# Patient Record
Sex: Male | Born: 1950 | Race: White | Hispanic: No | Marital: Married | Smoking: Never smoker
Health system: Southern US, Community
[De-identification: ages and names within clinical notes are randomized; demographics above are authoritative.]

## PROBLEM LIST (undated history)

## (undated) DIAGNOSIS — R7989 Other specified abnormal findings of blood chemistry: Secondary | ICD-10-CM

## (undated) DIAGNOSIS — I491 Atrial premature depolarization: Secondary | ICD-10-CM

## (undated) DIAGNOSIS — I10 Essential (primary) hypertension: Secondary | ICD-10-CM

## (undated) DIAGNOSIS — T148XXA Other injury of unspecified body region, initial encounter: Secondary | ICD-10-CM

## (undated) DIAGNOSIS — M1990 Unspecified osteoarthritis, unspecified site: Secondary | ICD-10-CM

## (undated) DIAGNOSIS — H9319 Tinnitus, unspecified ear: Secondary | ICD-10-CM

## (undated) DIAGNOSIS — A64 Unspecified sexually transmitted disease: Secondary | ICD-10-CM

## (undated) HISTORY — PX: HERNIA REPAIR: SHX51

## (undated) HISTORY — DX: Atrial premature depolarization: I49.1

## (undated) HISTORY — DX: Other injury of unspecified body region, initial encounter: T14.8XXA

## (undated) HISTORY — PX: APPENDECTOMY: SHX54

## (undated) HISTORY — DX: Unspecified sexually transmitted disease: A64

## (undated) HISTORY — PX: EYE SURGERY: SHX253

## (undated) HISTORY — DX: Other specified abnormal findings of blood chemistry: R79.89

## (undated) HISTORY — PX: TONSILLECTOMY: SUR1361

## (undated) HISTORY — DX: Tinnitus, unspecified ear: H93.19

## (undated) HISTORY — PX: SHOULDER ARTHROSCOPY: SHX128

---

## 2009-07-10 ENCOUNTER — Encounter: Admission: RE | Admit: 2009-07-10 | Discharge: 2009-07-10 | Payer: Self-pay | Admitting: Family Medicine

## 2011-01-23 ENCOUNTER — Other Ambulatory Visit: Payer: Self-pay | Admitting: Family Medicine

## 2011-01-23 DIAGNOSIS — Z139 Encounter for screening, unspecified: Secondary | ICD-10-CM

## 2011-01-23 DIAGNOSIS — R51 Headache: Secondary | ICD-10-CM

## 2011-01-24 ENCOUNTER — Other Ambulatory Visit: Payer: Self-pay

## 2011-02-01 ENCOUNTER — Other Ambulatory Visit: Payer: Self-pay | Admitting: Family Medicine

## 2011-02-01 DIAGNOSIS — Z139 Encounter for screening, unspecified: Secondary | ICD-10-CM

## 2011-02-04 ENCOUNTER — Ambulatory Visit
Admission: RE | Admit: 2011-02-04 | Discharge: 2011-02-04 | Disposition: A | Discharge status: Home / Self Care | Payer: BC Managed Care – PPO | Source: Ambulatory Visit | Attending: Family Medicine | Admitting: Family Medicine

## 2011-02-04 DIAGNOSIS — R51 Headache: Secondary | ICD-10-CM

## 2011-02-04 DIAGNOSIS — Z139 Encounter for screening, unspecified: Secondary | ICD-10-CM

## 2013-02-26 ENCOUNTER — Other Ambulatory Visit: Payer: Self-pay | Admitting: Orthopaedic Surgery

## 2013-02-26 DIAGNOSIS — M25511 Pain in right shoulder: Secondary | ICD-10-CM

## 2013-03-02 ENCOUNTER — Ambulatory Visit
Admission: RE | Admit: 2013-03-02 | Discharge: 2013-03-02 | Disposition: A | Discharge status: Home / Self Care | Payer: BC Managed Care – PPO | Source: Ambulatory Visit | Attending: Orthopaedic Surgery | Admitting: Orthopaedic Surgery

## 2013-03-02 DIAGNOSIS — M25511 Pain in right shoulder: Secondary | ICD-10-CM

## 2013-04-27 ENCOUNTER — Other Ambulatory Visit: Payer: Self-pay | Admitting: Family Medicine

## 2013-04-27 DIAGNOSIS — N390 Urinary tract infection, site not specified: Secondary | ICD-10-CM

## 2013-04-29 ENCOUNTER — Other Ambulatory Visit: Payer: BC Managed Care – PPO

## 2013-05-03 ENCOUNTER — Other Ambulatory Visit: Payer: BC Managed Care – PPO

## 2013-05-06 ENCOUNTER — Other Ambulatory Visit: Payer: BC Managed Care – PPO

## 2013-05-10 ENCOUNTER — Ambulatory Visit
Admission: RE | Admit: 2013-05-10 | Discharge: 2013-05-10 | Disposition: A | Discharge status: Home / Self Care | Payer: BC Managed Care – PPO | Source: Ambulatory Visit | Attending: Family Medicine | Admitting: Family Medicine

## 2013-05-10 DIAGNOSIS — N390 Urinary tract infection, site not specified: Secondary | ICD-10-CM

## 2013-05-10 MED ORDER — IOHEXOL 300 MG/ML  SOLN
125.0000 mL | Freq: Once | INTRAMUSCULAR | Status: AC | PRN
  Administered 2013-05-10: 125 mL via INTRAVENOUS

## 2014-01-22 ENCOUNTER — Ambulatory Visit: Payer: Self-pay | Admitting: Orthopedic Surgery

## 2014-01-22 NOTE — Progress Notes (Signed)
Preoperative surgical orders have been place into the Epic hospital system for Steve Murphy on 01/22/2014, 9:32 AM  by Patrica DuelPERKINS, Danyale Ridinger for surgery on 02/04/2014.  Preop Total Hip - Anterior Approach orders including Experel Injecion, IV Tylenol, and IV Decadron as long as there are no contraindications to the above medications. Steve Peacerew Shanard Treto, PA-C

## 2014-01-24 ENCOUNTER — Encounter (HOSPITAL_COMMUNITY)
Admission: RE | Admit: 2014-01-24 | Discharge: 2014-01-24 | Disposition: A | Discharge status: Home / Self Care | Payer: BLUE CROSS/BLUE SHIELD | Source: Ambulatory Visit | Attending: Orthopedic Surgery | Admitting: Orthopedic Surgery

## 2014-01-24 ENCOUNTER — Other Ambulatory Visit: Payer: Self-pay

## 2014-01-24 ENCOUNTER — Encounter (HOSPITAL_COMMUNITY): Payer: Self-pay

## 2014-01-24 DIAGNOSIS — M1612 Unilateral primary osteoarthritis, left hip: Secondary | ICD-10-CM | POA: Diagnosis not present

## 2014-01-24 DIAGNOSIS — Z01810 Encounter for preprocedural cardiovascular examination: Secondary | ICD-10-CM | POA: Insufficient documentation

## 2014-01-24 DIAGNOSIS — Z01812 Encounter for preprocedural laboratory examination: Secondary | ICD-10-CM | POA: Insufficient documentation

## 2014-01-24 DIAGNOSIS — Z01818 Encounter for other preprocedural examination: Secondary | ICD-10-CM | POA: Diagnosis not present

## 2014-01-24 HISTORY — DX: Essential (primary) hypertension: I10

## 2014-01-24 HISTORY — DX: Unspecified osteoarthritis, unspecified site: M19.90

## 2014-01-24 LAB — PROTIME-INR
INR: 0.98 (ref 0.00–1.49)
PROTHROMBIN TIME: 13.1 s (ref 11.6–15.2)

## 2014-01-24 LAB — CBC
HCT: 47.9 % (ref 39.0–52.0)
Hemoglobin: 16.2 g/dL (ref 13.0–17.0)
MCH: 30.8 pg (ref 26.0–34.0)
MCHC: 33.8 g/dL (ref 30.0–36.0)
MCV: 91.1 fL (ref 78.0–100.0)
PLATELETS: 256 10*3/uL (ref 150–400)
RBC: 5.26 MIL/uL (ref 4.22–5.81)
RDW: 13.1 % (ref 11.5–15.5)
WBC: 8.7 10*3/uL (ref 4.0–10.5)

## 2014-01-24 LAB — URINALYSIS, ROUTINE W REFLEX MICROSCOPIC
BILIRUBIN URINE: NEGATIVE
Glucose, UA: NEGATIVE mg/dL
Hgb urine dipstick: NEGATIVE
Ketones, ur: NEGATIVE mg/dL
Leukocytes, UA: NEGATIVE
Nitrite: NEGATIVE
PROTEIN: NEGATIVE mg/dL
SPECIFIC GRAVITY, URINE: 1.017 (ref 1.005–1.030)
Urobilinogen, UA: 0.2 mg/dL (ref 0.0–1.0)
pH: 6 (ref 5.0–8.0)

## 2014-01-24 LAB — APTT: APTT: 26 s (ref 24–37)

## 2014-01-24 LAB — COMPREHENSIVE METABOLIC PANEL
ALBUMIN: 4.4 g/dL (ref 3.5–5.2)
ALT: 22 U/L (ref 0–53)
ANION GAP: 5 (ref 5–15)
AST: 21 U/L (ref 0–37)
Alkaline Phosphatase: 93 U/L (ref 39–117)
BUN: 21 mg/dL (ref 6–23)
CO2: 27 mmol/L (ref 19–32)
CREATININE: 1.21 mg/dL (ref 0.50–1.35)
Calcium: 9.4 mg/dL (ref 8.4–10.5)
Chloride: 105 mEq/L (ref 96–112)
GFR calc non Af Amer: 62 mL/min — ABNORMAL LOW (ref >90)
GFR, EST AFRICAN AMERICAN: 71 mL/min — AB (ref >90)
Glucose, Bld: 97 mg/dL (ref 70–99)
Potassium: 4.3 mmol/L (ref 3.5–5.1)
Sodium: 137 mmol/L (ref 135–145)
Total Bilirubin: 0.9 mg/dL (ref 0.3–1.2)
Total Protein: 7 g/dL (ref 6.0–8.3)

## 2014-01-24 LAB — SURGICAL PCR SCREEN
MRSA, PCR: NEGATIVE
STAPHYLOCOCCUS AUREUS: POSITIVE — AB

## 2014-01-24 LAB — ABO/RH: ABO/RH(D): B POS

## 2014-01-24 LAB — TYPE AND SCREEN
ABO/RH(D): B POS
Antibody Screen: NEGATIVE

## 2014-01-24 NOTE — Progress Notes (Signed)
Mupirocin Ointment Rx called into [facility] for positive PCR of staph. Pt notified and voiced understanding.

## 2014-01-24 NOTE — Pre-Procedure Instructions (Addendum)
Steve Murphy  01/24/2014   Your procedure is scheduled on:  02/04/2014  [facility]   ENTRANCE  A at 7:45 AM.  Call this number if you have problems the morning of surgery: [phone]   Remember:   Do not eat food or drink liquids after midnight.  On THURSDAY   Take these medicines the morning of surgery with A SIP OF WATER: NOTHING   Do not wear jewelry   Do not wear lotions, powders, or perfumes. You may wear deodorant.    Men may shave face and neck.   Do not bring valuables to the hospital.  [facility] is not responsible                  for any belongings or valuables.               Contacts, dentures or bridgework may not be worn into surgery.   Leave suitcase in the car. After surgery it may be brought to your room.   For patients admitted to the hospital, discharge time is determined by your  treatment team.               Patients discharged the day of surgery will not be allowed to drive  home.  Name and phone number of your driver: wife   Special Instructions: Special Instructions: [place] - Preparing for Surgery  Before surgery, you can play an important role.  Because skin is not sterile, your skin needs to be as free of germs as possible.  You can reduce the number of germs on you skin by washing with CHG (chlorahexidine gluconate) soap before surgery.  CHG is an antiseptic cleaner which kills germs and bonds with the skin to continue killing germs even after washing.  Please DO NOT use if you have an allergy to CHG or antibacterial soaps.  If your skin becomes reddened/irritated stop using the CHG and inform your nurse when you arrive at Short Stay.  Do not shave (including legs and underarms) for at least 48 hours prior to the first CHG shower.  You may shave your face.  Please follow these instructions carefully:   1.  Shower with CHG Soap the night before surgery and the  morning of Surgery.  2.  If you choose to wash  your hair, wash your hair first as usual with your  normal shampoo.  3.  After you shampoo, rinse your hair and body thoroughly to remove the  Shampoo.  4.  Use CHG as you would any other liquid soap.  You can apply chg directly to the skin and wash gently with scrungie or a clean washcloth.  5.  Apply the CHG Soap to your body ONLY FROM THE NECK DOWN.    Do not use on open wounds or open sores.  Avoid contact with your eyes, ears, mouth and genitals (private parts).  Wash genitals (private parts)   with your normal soap.  6.  Wash thoroughly, paying special attention to the area where your surgery will be performed.  7.  Thoroughly rinse your body with warm water from the neck down.  8.  DO NOT shower/wash with your normal soap after using and rinsing off   the CHG Soap.  9.  Pat yourself dry with a clean towel.            10.  Wear clean pajamas.  11.  Place clean sheets on your bed the night of your first shower and do not sleep with pets.  Day of Surgery  Do not apply any lotions/deodorants the morning of surgery.  Please wear clean clothes to the hospital/surgery center.   Please read over the following fact sheets that you were given: Pain Booklet, Coughing and Deep Breathing, Blood Transfusion Information, MRSA Information and Surgical Site Infection Prevention

## 2014-01-24 NOTE — Progress Notes (Signed)
Pt. [facility]- [facility]- for car racing screening. Had EKG- /w PCP Fayrene FearingJames Little 2-3 yrs. Ago.

## 2014-02-03 ENCOUNTER — Ambulatory Visit: Payer: Self-pay | Admitting: Orthopedic Surgery

## 2014-02-03 MED ORDER — CEFAZOLIN SODIUM-DEXTROSE 2-3 GM-% IV SOLR
2.0000 g | INTRAVENOUS | Status: AC
  Administered 2014-02-04: 2 g via INTRAVENOUS
  Filled 2014-02-03: qty 50

## 2014-02-03 MED ORDER — DEXAMETHASONE SODIUM PHOSPHATE 10 MG/ML IJ SOLN
10.0000 mg | Freq: Once | INTRAMUSCULAR | Status: DC
  Filled 2014-02-03: qty 1

## 2014-02-03 MED ORDER — TRANEXAMIC ACID 100 MG/ML IV SOLN
1000.0000 mg | INTRAVENOUS | Status: AC
  Administered 2014-02-04: 1000 mg via INTRAVENOUS
  Filled 2014-02-03: qty 10

## 2014-02-03 MED ORDER — BUPIVACAINE LIPOSOME 1.3 % IJ SUSP
20.0000 mL | Freq: Once | INTRAMUSCULAR | Status: DC
  Filled 2014-02-03: qty 20

## 2014-02-03 MED ORDER — SODIUM CHLORIDE 0.9 % IV SOLN: INTRAVENOUS | Status: DC

## 2014-02-03 MED ORDER — ACETAMINOPHEN 10 MG/ML IV SOLN: 1000.0000 mg | Freq: Once | INTRAVENOUS | Status: DC

## 2014-02-03 NOTE — Anesthesia Preprocedure Evaluation (Addendum)
Anesthesia Evaluation  Patient identified by MRN, date of birth, ID band Patient awake    Reviewed: Allergy & Precautions, H&P , NPO status , Patient's Chart, lab work & pertinent test results, reviewed documented beta blocker date and time   Airway Mallampati: II  TM Distance: >3 FB     Dental  (+) Teeth Intact, Dental Advidsory Given   Pulmonary neg pulmonary ROS,          Cardiovascular hypertension, Pt. on medications  EKG WNL   Neuro/Psych negative neurological ROS  negative psych ROS   GI/Hepatic Neg liver ROS,   Endo/Other  negative endocrine ROS  Renal/GU negative Renal ROSGFR 68     Musculoskeletal   Abdominal   Peds  Hematology negative hematology ROS (+)   Anesthesia Other Findings   Reproductive/Obstetrics                          Anesthesia Physical Anesthesia Plan  ASA: II  Anesthesia Plan: General   Post-op Pain Management:    Induction: Intravenous  Airway Management Planned: Oral ETT  Additional Equipment:   Intra-op Plan:   Post-operative Plan: Extubation in OR  Informed Consent: I have reviewed the patients History and Physical, chart, labs and discussed the procedure including the risks, benefits and alternatives for the proposed anesthesia with the patient or authorized representative who has indicated his/her understanding and acceptance.   Dental Advisory Given  Plan Discussed with: Anesthesiologist, CRNA and Surgeon  Anesthesia Plan Comments:       Anesthesia Quick Evaluation

## 2014-02-03 NOTE — H&P (Signed)
Steve Murphy DOB: 09/25/1950 Married / Language: English / Race: White Male Date of Admission: 02/04/2014 CC:  Left Hip Pain History of Present Illness The patient is a 63 year old male who comes in  for a preoperative History and Physical. The patient is scheduled for a left total hip arthroplasty (anterior appraoch) to be performed by Dr. Frank V. Aluisio, MD at [facility] on 02-04-2014. The patient is a 63 year old male who presents with a hip problem. The patient reports left hip problems including pain symptoms that have been present for 4 month(s). The symptoms began without any known injury. Symptoms reported include hip pain and difficulty flexing hip The patient reports symptoms radiating to the: left groin and left thigh anteriorly. The patient describes the hip problem as aching.The symptoms are described as moderate in severity (sometimes severe).The patient feels as if their symptoms are does feel they are worsening. Associated symptoms include low back pain, numbness in the leg (occasionally) and weakness of the leg. Current treatment includes nonsteroidal anti-inflammatory drugs. Prior to being seen today the patient was previously evaluated by a colleague. Previous workup for this problem has included hip x-rays.  He says that he has had pain this for past 3 or 4 months. He describes groin pain. He does have groin pain in the left groin. No issues with the right side. Not having any issues along the lateral hip at this point in time. It is not radiating down his legs, towards his knees. He is at a point now where he is having pain at all times. It is worse with weightbearing activities. 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 This is getting to the point where it is limiting his activity and interfering with his duties on the farm. He needs a total hip replacement. He has had problems with the hip for many getting much worse in  the past year. He has pain with all activities and is now starting to get pain as rest. It is limiting what he can and cannot do. Intraarticular injection did not provide much benefit. 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 Risks and benefits of the surgery have been discussed with the patient and they elect to proceed with surgery.  There are on active contraindications to upcoming procedure such as ongoing infection or progressive neurological disease.  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  Social History  No history of drug/alcohol rehab Not under pain contract Marital status married Exercise Exercises rarely Living situation live with spouse Tobacco use Never smoker. 11/04/2013 Number of flights of stairs before winded 1 Tobacco / smoke exposure 11/04/2013: no Current work status working full time Children 2 Current drinker 11/04/2013: Currently drinks beer only occasionally per week  Medication History Naproxen (Oral as needed) Specific dose unknown - Active. Acyclovir (Oral as needed) Specific dose unknown - Active. Ibuprofen (200MG Capsule, Oral as needed) Active. Multivitamin (Oral) Active. Vitamin D (Oral) Specific dose unknown - Active. Fish Oil (Oral) Specific dose unknown - Active.  Past Surgical History Tonsillectomy Appendectomy Rotator Cuff Repair right Hernia Repair   Bilateral Lasix Procedures Bilateral  Review of Systems: General: No chills, fevers, night sweats, fatigue. Neuro:  No seizures, syncope, paralysis, visual problems. Respiratory:  No shortness of  breath, productive cough, hemoptysis. Cardiovascular: No chest pain, angina, palpitations, orthopnea. GI: No nausea, vomiting, diarrhea, constipation. GU:  No dysuria, hematuria, discharge. Musculoskeletal:  Joint pain   Vitals  Weight: 211 lb Height: 71in Weight was reported by patient. Height was reported by patient. Body Surface Area: 2.16 m Body Mass Index: 29.43 kg/m  BP: 152/100 (Sitting, Left Arm, Standard)   Physical Exam General Mental Status -Alert, cooperative and good historian. General Appearance-pleasant, Not in acute distress. Orientation-Oriented X3. Build & Nutrition-Well nourished and Well developed.  Head and Neck Head-normocephalic, atraumatic . Neck Global Assessment - supple, no bruit auscultated on the right, no bruit auscultated on the left.  Eye Vision-Wears corrective lenses(readers). Pupil - Bilateral-Regular and Round. Motion - Bilateral-EOMI.  Chest and Lung Exam Auscultation Breath sounds - clear at anterior chest wall and clear at posterior chest wall. Adventitious sounds - No Adventitious sounds.  Cardiovascular Auscultation Rhythm - Regular rate and rhythm. Heart Sounds - S1 WNL and S2 WNL. Murmurs & Other Heart Sounds - Auscultation of the heart reveals - No Murmurs.  Abdomen Palpation/Percussion Tenderness - Abdomen is non-tender to palpation. Rigidity (guarding) - Abdomen is soft. Auscultation Auscultation of the abdomen reveals - Bowel sounds normal.  Male Genitourinary Note: Not done, not pertinent to present illness   Musculoskeletal Note: He is alert and oriented in no acute distress. He does have some discomfort with lumbar range of motion most notably over that SI joint. He is nontender over the right and left greater trochanteric bursa. No masses or tumors palpated over this area. The right hip he has normal painless range of motion in the left hip. Flexion to about 110 degrees, internal  rotation of 10 degrees, external rotation of 20, abduction 20. He does have significant discomfort with passive and active rotation of that hip. The knees are nontender to palpation. No effusion or instability noted about the knees. Distal pulses are 2+. Sensation and motor function are intact in the lower extremities.  RADIOGRAPHS: AP and lateral views of the lumbar spine shows that he does have some degenerative changes, disc spaces most notably at L4-5 and L5-S1 with some signs of spondylolisthesis at L5. He does have significant arthritic changes in that left hip. He is bone on bone and does have some osteophyte formation most notably in the inferior portion of that joint.  Assessment & Plan Primary osteoarthritis of left hip (M16.12)  Note:Plan is for a Left Total Hip Replacement -anterior approach by Dr. Aluisio.  Plan is to go home.  PCP - Dr. James Little  The patient does not have any contraindications and will receive TXA (tranexamic acid) prior to surgery.  No problems with anesthesia.  Drew Dexton Zwilling, PA-C  

## 2014-02-04 ENCOUNTER — Inpatient Hospital Stay (HOSPITAL_COMMUNITY)
Admission: RE | Admit: 2014-02-04 | Discharge: 2014-02-06 | DRG: 470 | Disposition: A | Discharge status: Home Health | Payer: BLUE CROSS/BLUE SHIELD | Source: Ambulatory Visit | Attending: Orthopedic Surgery | Admitting: Orthopedic Surgery

## 2014-02-04 ENCOUNTER — Encounter (HOSPITAL_COMMUNITY)
Admission: RE | Disposition: A | Discharge status: Home Health | Payer: Self-pay | Source: Ambulatory Visit | Attending: Orthopedic Surgery

## 2014-02-04 ENCOUNTER — Encounter (HOSPITAL_COMMUNITY): Payer: Self-pay | Admitting: *Deleted

## 2014-02-04 ENCOUNTER — Inpatient Hospital Stay (HOSPITAL_COMMUNITY): Payer: BLUE CROSS/BLUE SHIELD | Admitting: Anesthesiology

## 2014-02-04 ENCOUNTER — Inpatient Hospital Stay (HOSPITAL_COMMUNITY): Payer: BLUE CROSS/BLUE SHIELD

## 2014-02-04 DIAGNOSIS — M545 Low back pain: Secondary | ICD-10-CM | POA: Diagnosis present

## 2014-02-04 DIAGNOSIS — Z96649 Presence of unspecified artificial hip joint: Secondary | ICD-10-CM

## 2014-02-04 DIAGNOSIS — Z01810 Encounter for preprocedural cardiovascular examination: Secondary | ICD-10-CM | POA: Diagnosis not present

## 2014-02-04 DIAGNOSIS — M1612 Unilateral primary osteoarthritis, left hip: Principal | ICD-10-CM | POA: Diagnosis present

## 2014-02-04 DIAGNOSIS — Z791 Long term (current) use of non-steroidal anti-inflammatories (NSAID): Secondary | ICD-10-CM | POA: Diagnosis not present

## 2014-02-04 DIAGNOSIS — M169 Osteoarthritis of hip, unspecified: Secondary | ICD-10-CM | POA: Diagnosis present

## 2014-02-04 DIAGNOSIS — Z01812 Encounter for preprocedural laboratory examination: Secondary | ICD-10-CM | POA: Diagnosis not present

## 2014-02-04 DIAGNOSIS — M25552 Pain in left hip: Secondary | ICD-10-CM | POA: Diagnosis present

## 2014-02-04 DIAGNOSIS — G8929 Other chronic pain: Secondary | ICD-10-CM | POA: Diagnosis present

## 2014-02-04 DIAGNOSIS — M25752 Osteophyte, left hip: Secondary | ICD-10-CM | POA: Diagnosis present

## 2014-02-04 DIAGNOSIS — I10 Essential (primary) hypertension: Secondary | ICD-10-CM | POA: Diagnosis present

## 2014-02-04 DIAGNOSIS — Z79899 Other long term (current) drug therapy: Secondary | ICD-10-CM

## 2014-02-04 HISTORY — PX: TOTAL HIP ARTHROPLASTY: SHX124

## 2014-02-04 SURGERY — ARTHROPLASTY, HIP, TOTAL, ANTERIOR APPROACH
Anesthesia: General | Site: Hip | Laterality: Left

## 2014-02-04 MED ORDER — FENTANYL CITRATE 0.05 MG/ML IJ SOLN
25.0000 ug | INTRAMUSCULAR | Status: DC | PRN
  Administered 2014-02-04 (×3): 50 ug via INTRAVENOUS

## 2014-02-04 MED ORDER — DIPHENHYDRAMINE HCL 12.5 MG/5ML PO ELIX: 12.5000 mg | ORAL_SOLUTION | ORAL | Status: DC | PRN

## 2014-02-04 MED ORDER — DOCUSATE SODIUM 100 MG PO CAPS
100.0000 mg | ORAL_CAPSULE | Freq: Two times a day (BID) | ORAL | Status: DC
  Administered 2014-02-04 – 2014-02-06 (×5): 100 mg via ORAL
  Filled 2014-02-04 (×5): qty 1

## 2014-02-04 MED ORDER — ACETAMINOPHEN 325 MG PO TABS: 650.0000 mg | ORAL_TABLET | Freq: Four times a day (QID) | ORAL | Status: DC | PRN

## 2014-02-04 MED ORDER — KETOROLAC TROMETHAMINE 15 MG/ML IJ SOLN
7.5000 mg | Freq: Four times a day (QID) | INTRAMUSCULAR | Status: AC | PRN
  Administered 2014-02-04: 7.5 mg via INTRAVENOUS
  Filled 2014-02-04: qty 1

## 2014-02-04 MED ORDER — PROPOFOL 10 MG/ML IV BOLUS
INTRAVENOUS | Status: AC
  Filled 2014-02-04: qty 20

## 2014-02-04 MED ORDER — MIDAZOLAM HCL 5 MG/5ML IJ SOLN
INTRAMUSCULAR | Status: DC | PRN
  Administered 2014-02-04: 2 mg via INTRAVENOUS

## 2014-02-04 MED ORDER — METHOCARBAMOL 500 MG PO TABS
500.0000 mg | ORAL_TABLET | Freq: Four times a day (QID) | ORAL | Status: DC | PRN
  Administered 2014-02-05 – 2014-02-06 (×4): 500 mg via ORAL
  Filled 2014-02-04 (×5): qty 1

## 2014-02-04 MED ORDER — MORPHINE SULFATE 2 MG/ML IJ SOLN
1.0000 mg | INTRAMUSCULAR | Status: DC | PRN
  Administered 2014-02-04: 2 mg via INTRAVENOUS
  Filled 2014-02-04: qty 1

## 2014-02-04 MED ORDER — PHENOL 1.4 % MT LIQD: 1.0000 | OROMUCOSAL | Status: DC | PRN

## 2014-02-04 MED ORDER — ACETAMINOPHEN 500 MG PO TABS
1000.0000 mg | ORAL_TABLET | Freq: Four times a day (QID) | ORAL | Status: AC
  Administered 2014-02-04 – 2014-02-05 (×4): 1000 mg via ORAL
  Filled 2014-02-04 (×5): qty 2

## 2014-02-04 MED ORDER — OXYCODONE HCL 5 MG PO TABS
ORAL_TABLET | ORAL | Status: AC
  Filled 2014-02-04: qty 2

## 2014-02-04 MED ORDER — METOCLOPRAMIDE HCL 5 MG/ML IJ SOLN
5.0000 mg | Freq: Three times a day (TID) | INTRAMUSCULAR | Status: DC | PRN
Start: 2014-02-04 — End: 2014-02-06

## 2014-02-04 MED ORDER — MENTHOL 3 MG MT LOZG: 1.0000 | LOZENGE | OROMUCOSAL | Status: DC | PRN

## 2014-02-04 MED ORDER — FENTANYL CITRATE 0.05 MG/ML IJ SOLN
INTRAMUSCULAR | Status: DC | PRN
  Administered 2014-02-04 (×2): 100 ug via INTRAVENOUS
  Administered 2014-02-04: 50 ug via INTRAVENOUS
  Administered 2014-02-04: 100 ug via INTRAVENOUS

## 2014-02-04 MED ORDER — LIDOCAINE HCL (CARDIAC) 20 MG/ML IV SOLN
INTRAVENOUS | Status: DC | PRN
  Administered 2014-02-04: 100 mg via INTRAVENOUS

## 2014-02-04 MED ORDER — MEPERIDINE HCL 25 MG/ML IJ SOLN: 6.2500 mg | INTRAMUSCULAR | Status: DC | PRN

## 2014-02-04 MED ORDER — DEXAMETHASONE SODIUM PHOSPHATE 10 MG/ML IJ SOLN
INTRAMUSCULAR | Status: DC | PRN
  Administered 2014-02-04: 10 mg via INTRAVENOUS

## 2014-02-04 MED ORDER — POLYETHYLENE GLYCOL 3350 17 G PO PACK: 17.0000 g | PACK | Freq: Every day | ORAL | Status: DC | PRN

## 2014-02-04 MED ORDER — CHLORHEXIDINE GLUCONATE 4 % EX LIQD: 60.0000 mL | Freq: Once | CUTANEOUS | Status: DC

## 2014-02-04 MED ORDER — LACTATED RINGERS IV SOLN
INTRAVENOUS | Status: DC
  Administered 2014-02-04: 08:00:00 via INTRAVENOUS

## 2014-02-04 MED ORDER — ROCURONIUM BROMIDE 50 MG/5ML IV SOLN
INTRAVENOUS | Status: AC
  Filled 2014-02-04: qty 1

## 2014-02-04 MED ORDER — OXYCODONE HCL 5 MG PO TABS
5.0000 mg | ORAL_TABLET | ORAL | Status: DC | PRN
  Administered 2014-02-04 – 2014-02-06 (×8): 10 mg via ORAL
  Filled 2014-02-04 (×7): qty 2

## 2014-02-04 MED ORDER — BUPIVACAINE HCL (PF) 0.25 % IJ SOLN
INTRAMUSCULAR | Status: AC
  Filled 2014-02-04: qty 30

## 2014-02-04 MED ORDER — METHOCARBAMOL 1000 MG/10ML IJ SOLN
500.0000 mg | Freq: Four times a day (QID) | INTRAVENOUS | Status: DC | PRN
  Administered 2014-02-04: 500 mg via INTRAVENOUS
  Filled 2014-02-04 (×2): qty 5

## 2014-02-04 MED ORDER — MIDAZOLAM HCL 2 MG/2ML IJ SOLN
INTRAMUSCULAR | Status: AC
  Filled 2014-02-04: qty 2

## 2014-02-04 MED ORDER — METOCLOPRAMIDE HCL 10 MG PO TABS
5.0000 mg | ORAL_TABLET | Freq: Three times a day (TID) | ORAL | Status: DC | PRN
Start: 2014-02-04 — End: 2014-02-06

## 2014-02-04 MED ORDER — DEXAMETHASONE SODIUM PHOSPHATE 10 MG/ML IJ SOLN
10.0000 mg | Freq: Once | INTRAMUSCULAR | Status: AC
  Administered 2014-02-05: 10 mg via INTRAVENOUS
  Filled 2014-02-04: qty 1

## 2014-02-04 MED ORDER — FLEET ENEMA 7-19 GM/118ML RE ENEM: 1.0000 | ENEMA | Freq: Once | RECTAL | Status: AC | PRN

## 2014-02-04 MED ORDER — PROPOFOL 10 MG/ML IV BOLUS
INTRAVENOUS | Status: DC | PRN
  Administered 2014-02-04: 150 mg via INTRAVENOUS

## 2014-02-04 MED ORDER — EPHEDRINE SULFATE 50 MG/ML IJ SOLN
INTRAMUSCULAR | Status: AC
  Filled 2014-02-04: qty 1

## 2014-02-04 MED ORDER — ACETAMINOPHEN 10 MG/ML IV SOLN
INTRAVENOUS | Status: AC
  Administered 2014-02-04: 1000 mg via INTRAVENOUS
  Filled 2014-02-04: qty 100

## 2014-02-04 MED ORDER — BUPIVACAINE HCL (PF) 0.25 % IJ SOLN
INTRAMUSCULAR | Status: DC | PRN
  Administered 2014-02-04: 20 mL

## 2014-02-04 MED ORDER — CEFAZOLIN SODIUM-DEXTROSE 2-3 GM-% IV SOLR
2.0000 g | Freq: Four times a day (QID) | INTRAVENOUS | Status: AC
  Administered 2014-02-04 (×2): 2 g via INTRAVENOUS
  Filled 2014-02-04 (×2): qty 50

## 2014-02-04 MED ORDER — ONDANSETRON HCL 4 MG/2ML IJ SOLN: 4.0000 mg | Freq: Four times a day (QID) | INTRAMUSCULAR | Status: DC | PRN

## 2014-02-04 MED ORDER — METHOCARBAMOL 1000 MG/10ML IJ SOLN
500.0000 mg | INTRAVENOUS | Status: AC
  Administered 2014-02-04: 500 mg via INTRAVENOUS
  Filled 2014-02-04: qty 5

## 2014-02-04 MED ORDER — ONDANSETRON HCL 4 MG PO TABS: 4.0000 mg | ORAL_TABLET | Freq: Four times a day (QID) | ORAL | Status: DC | PRN

## 2014-02-04 MED ORDER — FENTANYL CITRATE 0.05 MG/ML IJ SOLN
INTRAMUSCULAR | Status: AC
  Filled 2014-02-04: qty 5

## 2014-02-04 MED ORDER — FENTANYL CITRATE 0.05 MG/ML IJ SOLN
INTRAMUSCULAR | Status: AC
  Filled 2014-02-04: qty 2

## 2014-02-04 MED ORDER — ONDANSETRON HCL 4 MG/2ML IJ SOLN
INTRAMUSCULAR | Status: DC | PRN
Start: 2014-02-04 — End: 2014-02-04
  Administered 2014-02-04: 4 mg via INTRAVENOUS

## 2014-02-04 MED ORDER — FENTANYL CITRATE 0.05 MG/ML IJ SOLN
INTRAMUSCULAR | Status: AC
  Administered 2014-02-04: 50 ug via INTRAVENOUS
  Filled 2014-02-04: qty 2

## 2014-02-04 MED ORDER — SODIUM CHLORIDE 0.9 % IJ SOLN
INTRAMUSCULAR | Status: DC | PRN
  Administered 2014-02-04: 30 mL

## 2014-02-04 MED ORDER — POTASSIUM CHLORIDE IN NACL 20-0.9 MEQ/L-% IV SOLN
INTRAVENOUS | Status: DC
  Administered 2014-02-04: 21:00:00 via INTRAVENOUS
  Filled 2014-02-04 (×5): qty 1000

## 2014-02-04 MED ORDER — 0.9 % SODIUM CHLORIDE (POUR BTL) OPTIME
TOPICAL | Status: DC | PRN
  Administered 2014-02-04: 1000 mL

## 2014-02-04 MED ORDER — DEXMEDETOMIDINE HCL 200 MCG/2ML IV SOLN
INTRAVENOUS | Status: DC | PRN
  Administered 2014-02-04 (×2): 12 ug via INTRAVENOUS

## 2014-02-04 MED ORDER — LACTATED RINGERS IV SOLN
INTRAVENOUS | Status: DC | PRN
  Administered 2014-02-04 (×3): via INTRAVENOUS

## 2014-02-04 MED ORDER — RIVAROXABAN 10 MG PO TABS
10.0000 mg | ORAL_TABLET | Freq: Every day | ORAL | Status: DC
  Administered 2014-02-05 – 2014-02-06 (×2): 10 mg via ORAL
  Filled 2014-02-04 (×3): qty 1

## 2014-02-04 MED ORDER — TRAMADOL HCL 50 MG PO TABS: 50.0000 mg | ORAL_TABLET | Freq: Four times a day (QID) | ORAL | Status: DC | PRN

## 2014-02-04 MED ORDER — BUPIVACAINE LIPOSOME 1.3 % IJ SUSP
INTRAMUSCULAR | Status: DC | PRN
  Administered 2014-02-04: 20 mL

## 2014-02-04 MED ORDER — ACETAMINOPHEN 650 MG RE SUPP: 650.0000 mg | Freq: Four times a day (QID) | RECTAL | Status: DC | PRN

## 2014-02-04 MED ORDER — ROCURONIUM BROMIDE 100 MG/10ML IV SOLN
INTRAVENOUS | Status: DC | PRN
  Administered 2014-02-04: 40 mg via INTRAVENOUS

## 2014-02-04 MED ORDER — LIDOCAINE HCL (CARDIAC) 20 MG/ML IV SOLN
INTRAVENOUS | Status: AC
  Filled 2014-02-04: qty 5

## 2014-02-04 MED ORDER — ONDANSETRON HCL 4 MG/2ML IJ SOLN
INTRAMUSCULAR | Status: AC
  Filled 2014-02-04: qty 2

## 2014-02-04 MED ORDER — KETOROLAC TROMETHAMINE 15 MG/ML IJ SOLN
INTRAMUSCULAR | Status: AC
  Administered 2014-02-04: 7.5 mg via INTRAVENOUS
  Filled 2014-02-04: qty 1

## 2014-02-04 MED ORDER — BISACODYL 10 MG RE SUPP: 10.0000 mg | Freq: Every day | RECTAL | Status: DC | PRN

## 2014-02-04 MED ORDER — PROMETHAZINE HCL 25 MG/ML IJ SOLN: 6.2500 mg | INTRAMUSCULAR | Status: DC | PRN

## 2014-02-04 SURGICAL SUPPLY — 49 items
BLADE SAW SGTL 18X1.27X75 (BLADE) ×2 IMPLANT
BLADE SAW SGTL 18X1.27X75MM (BLADE) ×1
CAPT HIP TOTAL 2 ×2 IMPLANT
CLOSURE STERI-STRIP 1/2X4 (GAUZE/BANDAGES/DRESSINGS) ×2
CLOSURE WOUND 1/2 X4 (GAUZE/BANDAGES/DRESSINGS) ×1
CLSR STERI-STRIP ANTIMIC 1/2X4 (GAUZE/BANDAGES/DRESSINGS) ×4 IMPLANT
DRAPE C-ARM 42X72 X-RAY (DRAPES) ×3 IMPLANT
DRAPE IMP U-DRAPE 54X76 (DRAPES) ×1 IMPLANT
DRAPE STERI IOBAN 125X83 (DRAPES) ×3 IMPLANT
DRAPE U-SHAPE 47X51 STRL (DRAPES) ×7 IMPLANT
DRSG MEPILEX BORDER 4X4 (GAUZE/BANDAGES/DRESSINGS) ×2 IMPLANT
DRSG MEPILEX BORDER 4X8 (GAUZE/BANDAGES/DRESSINGS) ×3 IMPLANT
DURAPREP 26ML APPLICATOR (WOUND CARE) ×3 IMPLANT
ELECT BLADE 6.5 EXT (BLADE) ×3 IMPLANT
ELECT REM PT RETURN 9FT ADLT (ELECTROSURGICAL) ×3
ELECTRODE REM PT RTRN 9FT ADLT (ELECTROSURGICAL) ×1 IMPLANT
EVACUATOR 1/8 PVC DRAIN (DRAIN) ×3 IMPLANT
FACESHIELD WRAPAROUND (MASK) ×9 IMPLANT
FACESHIELD WRAPAROUND OR TEAM (MASK) ×2 IMPLANT
GLOVE BIO SURGEON STRL SZ7.5 (GLOVE) ×3 IMPLANT
GLOVE BIO SURGEON STRL SZ8 (GLOVE) ×6 IMPLANT
GLOVE BIOGEL PI IND STRL 8 (GLOVE) ×2 IMPLANT
GLOVE BIOGEL PI IND STRL 8.5 (GLOVE) IMPLANT
GLOVE BIOGEL PI INDICATOR 8 (GLOVE) ×6
GLOVE BIOGEL PI INDICATOR 8.5 (GLOVE) ×2
GLOVE ECLIPSE 8.0 STRL XLNG CF (GLOVE) ×3 IMPLANT
GLOVE SURG ORTHO 8.5 STRL (GLOVE) ×2 IMPLANT
GLOVE SURG SS PI 6.5 STRL IVOR (GLOVE) ×2 IMPLANT
GOWN STRL REUS W/ TWL LRG LVL3 (GOWN DISPOSABLE) ×1 IMPLANT
GOWN STRL REUS W/ TWL XL LVL3 (GOWN DISPOSABLE) ×1 IMPLANT
GOWN STRL REUS W/TWL LRG LVL3 (GOWN DISPOSABLE) ×3
GOWN STRL REUS W/TWL XL LVL3 (GOWN DISPOSABLE) ×9
KIT BASIN OR (CUSTOM PROCEDURE TRAY) ×3 IMPLANT
NDL SAFETY ECLIPSE 18X1.5 (NEEDLE) ×1 IMPLANT
NEEDLE HYPO 18GX1.5 SHARP (NEEDLE) ×3
PACK TOTAL JOINT (CUSTOM PROCEDURE TRAY) ×3 IMPLANT
PACK UNIVERSAL I (CUSTOM PROCEDURE TRAY) ×3 IMPLANT
STRIP CLOSURE SKIN 1/2X4 (GAUZE/BANDAGES/DRESSINGS) ×1 IMPLANT
SUT ETHIBOND NAB CT1 #1 30IN (SUTURE) ×3 IMPLANT
SUT MNCRL AB 4-0 PS2 18 (SUTURE) ×3 IMPLANT
SUT VIC AB 1 CT1 27 (SUTURE) ×3
SUT VIC AB 1 CT1 27XBRD ANTBC (SUTURE) ×1 IMPLANT
SUT VIC AB 2-0 CT1 27 (SUTURE) ×6
SUT VIC AB 2-0 CT1 TAPERPNT 27 (SUTURE) ×2 IMPLANT
SUT VLOC 180 0 24IN GS25 (SUTURE) ×3 IMPLANT
SYR 20CC LL (SYRINGE) ×3 IMPLANT
SYR 50ML LL SCALE MARK (SYRINGE) ×3 IMPLANT
TOWEL OR 17X26 10 PK STRL BLUE (TOWEL DISPOSABLE) ×6 IMPLANT
TRAY FOLEY CATH 16FR SILVER (SET/KITS/TRAYS/PACK) ×3 IMPLANT

## 2014-02-04 NOTE — Op Note (Signed)
OPERATIVE REPORT  PREOPERATIVE DIAGNOSIS: Osteoarthritis of the Left hip.   POSTOPERATIVE DIAGNOSIS: Osteoarthritis of the Left  hip.   PROCEDURE: Left total hip arthroplasty, anterior approach.   SURGEON: Ollen Gross, MD   ASSISTANT: Avel Peace, PA-C  ANESTHESIA:  General  ESTIMATED BLOOD LOSS:- 300 ml  DRAINS: Hemovac x1.   COMPLICATIONS: None   CONDITION: PACU - hemodynamically stable.   BRIEF CLINICAL NOTE: Steve Murphy is a 64 y.o. male who has advanced end-  stage arthritis of his Left  hip with progressively worsening pain and  dysfunction.The patient has failed nonoperative management and presents for  total hip arthroplasty.   PROCEDURE IN DETAIL: After successful administration of spinal  anesthetic, the traction boots for the [facility] bed were placed on both  feet and the patient was placed onto the [facility] bed, boots placed into the leg  holders. The Left hip was then isolated from the perineum with plastic  drapes and prepped and draped in the usual sterile fashion. ASIS and  greater trochanter were marked and a oblique incision was made, starting  at about 1 cm lateral and 2 cm distal to the ASIS and coursing towards  the anterior cortex of the femur. The skin was cut with a 10 blade  through subcutaneous tissue to the level of the fascia overlying the  tensor fascia lata muscle. The fascia was then incised in line with the  incision at the junction of the anterior third and posterior 2/3rd. The  muscle was teased off the fascia and then the interval between the TFL  and the rectus was developed. The Hohmann retractor was then placed at  the top of the femoral neck over the capsule. The vessels overlying the  capsule were cauterized and the fat on top of the capsule was removed.  A Hohmann retractor was then placed anterior underneath the rectus  femoris to give exposure to the entire anterior capsule. A T-shaped  capsulotomy was performed. The  edges were tagged and the femoral head  was identified.       Osteophytes are removed off the superior acetabulum.  The femoral neck was then cut in situ with an oscillating saw. Traction  was then applied to the left lower extremity utilizing the [facility]  traction. The femoral head was then removed. Retractors were placed  around the acetabulum and then circumferential removal of the labrum was  performed. Osteophytes were also removed. Reaming starts at 45 mm to  medialize and  Increased in 2 mm increments to 51 mm. We reamed in  approximately 40 degrees of abduction, 20 degrees anteversion. A 52 mm  pinnacle acetabular shell was then impacted in anatomic position under  fluoroscopic guidance with excellent purchase. We did not need to place  any additional dome screws. A 32 mm neutral + 4 marathon liner was then  placed into the acetabular shell.       The femoral lift was then placed along the lateral aspect of the femur  just distal to the vastus ridge. The leg was  externally rotated and capsule  was stripped off the inferior aspect of the femoral neck down to the  level of the lesser trochanter, this was done with electrocautery. The femur was lifted after this was performed. The  leg was then placed and extended in adducted position to essentially delivering the femur. We also removed the capsule superiorly and the  piriformis from the piriformis  fossa to gain excellent exposure of the  proximal femur. Rongeur was used to remove some cancellous bone to get  into the lateral portion of the proximal femur for placement of the  initial starter reamer. The starter broaches was placed  the starter broach  and was shown to go down the center of the canal. Broaching  with the  Corail system was then performed starting at size 8, coursing  Up to size 13. A size 13 had excellent torsional and rotational  and axial stability. The trial standard offset neck was then placed  with a 32 + 5 trial  head. The hip was then reduced. We confirmed that  the stem was in the canal both on AP and lateral x-rays. It also has excellent sizing. The hip was reduced with outstanding stability through full extension, full external rotation,  and then flexion in adduction internal rotation. AP pelvis was taken  and the leg lengths were measured and found to be exactly equal. Hip  was then dislocated again and the femoral head and neck removed. The  femoral broach was removed. Size 13 Corail stem with a standard offset  neck was then impacted into the femur following native anteversion. Has  excellent purchase in the canal. Excellent torsional and rotational and  axial stability. It is confirmed to be in the canal on AP and lateral  fluoroscopic views. The 32 + 5 ceramic head was placed and the hip  reduced with outstanding stability. Again AP pelvis was taken and it  confirmed that the leg lengths were equal. The wound was then copiously  irrigated with saline solution and the capsule reattached and repaired  with Ethibond suture.  20 mL of Exparel mixed with 50 mL of saline then additional 20 ml of .25% Bupivicaine injected into the capsule and into the edge of the tensor fascia lata as well as subcutaneous tissue. The fascia overlying the tensor fascia lata was  then closed with a running #1 V-Loc. Subcu was closed with interrupted  2-0 Vicryl and subcuticular running 4-0 Monocryl. Incision was cleaned  and dried. Steri-Strips and a bulky sterile dressing applied. Hemovac  drain was hooked to suction and then he was awakened and transported to  recovery in stable condition.        Please note that a surgical assistant was a medical necessity for this procedure to perform it in a safe and expeditious manner. Assistant was necessary to provide appropriate retraction of vital neurovascular structures and to prevent femoral fracture and allow for anatomic placement of the prosthesis.  Ollen GrossFrank Sister Carbone, M.D.

## 2014-02-04 NOTE — Anesthesia Procedure Notes (Addendum)
Procedure Name: Intubation Date/Time: 02/04/2014 10:00 AM Performed by: Carmela RimaMARTINELLI, JOHN F Pre-anesthesia Checklist: Timeout performed, Patient being monitored, Suction available, Emergency Drugs available and Patient identified Patient Re-evaluated:Patient Re-evaluated prior to inductionOxygen Delivery Method: Circle system utilized Preoxygenation: Pre-oxygenation with 100% oxygen Intubation Type: IV induction Ventilation: Mask ventilation without difficulty Laryngoscope Size: Mac and 3 Grade View: Grade I Tube type: Oral Number of attempts: 1 Placement Confirmation: breath sounds checked- equal and bilateral,  positive ETCO2 and ETT inserted through vocal cords under direct vision Secured at: 23 cm Tube secured with: Tape Dental Injury: Teeth and Oropharynx as per pre-operative assessment

## 2014-02-04 NOTE — Interval H&P Note (Signed)
History and Physical Interval Note:  02/04/2014 8:46 AM  Steve Murphy  has presented today for surgery, with the diagnosis of LEFT HIP OA   The various methods of treatment have been discussed with the patient and family. After consideration of risks, benefits and other options for treatment, the patient has consented to  Procedure(s): LEFT TOTAL HIP ARTHROPLASTY ANTERIOR APPROACH (Left) as a surgical intervention .  The patient's history has been reviewed, patient examined, no change in status, stable for surgery.  I have reviewed the patient's chart and labs.  Questions were answered to the patient's satisfaction.     Loanne DrillingALUISIO,Jla Reynolds V

## 2014-02-04 NOTE — H&P (View-Only) (Signed)
Steve HarriesCharles Elliott DOB: 1950/07/22 Married / Language: English / Race: White Male Date of Admission: 02/04/2014 CC:  Left Hip Pain History of Present Illness The patient is a 64 year old male who comes in  for a preoperative History and Physical. The patient is scheduled for a left total hip arthroplasty (anterior appraoch) to be performed by Dr. Gus RankinFrank V. Aluisio, MD at [facility] on 02-04-2014. The patient is a 64 year old male who presents with a hip problem. The patient reports left hip problems including pain symptoms that have been present for 4 month(s). The symptoms began without any known injury. Symptoms reported include hip pain and difficulty flexing hip The patient reports symptoms radiating to the: left groin and left thigh anteriorly. The patient describes the hip problem as aching.The symptoms are described as moderate in severity (sometimes severe).The patient feels as if their symptoms are does feel they are worsening. Associated symptoms include low back pain, numbness in the leg (occasionally) and weakness of the leg. Current treatment includes nonsteroidal anti-inflammatory drugs. Prior to being seen today the patient was previously evaluated by a colleague. Previous workup for this problem has included hip x-rays.  He says that he has had pain this for past 3 or 4 months. He describes groin pain. He does have groin pain in the left groin. No issues with the right side. Not having any issues along the lateral hip at this point in time. It is not radiating down his legs, towards his knees. He is at a point now where he is having pain at all times. It is worse with weightbearing activities. 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 This is getting to the point where it is limiting his activity and interfering with his duties on the farm. He needs a total hip replacement. He has had problems with the hip for many getting much worse in  the past year. He has pain with all activities and is now starting to get pain as rest. It is limiting what he can and cannot do. Intraarticular injection did not provide much benefit. 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 Risks and benefits of the surgery have been discussed with the patient and they elect to proceed with surgery.  There are on active contraindications to upcoming procedure such as ongoing infection or progressive neurological disease.  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  Social History  No history of drug/alcohol rehab Not under pain contract Marital status married Exercise Exercises rarely Living situation live with spouse Tobacco use Never smoker. 11/04/2013 Number of flights of stairs before winded 1 Tobacco / smoke exposure 11/04/2013: no Current work status working full time Children 2 Current drinker 11/04/2013: Currently drinks beer only occasionally per week  Medication History Naproxen (Oral as needed) Specific dose unknown - Active. Acyclovir (Oral as needed) Specific dose unknown - Active. Ibuprofen (200MG  Capsule, Oral as needed) Active. Multivitamin (Oral) Active. Vitamin D (Oral) Specific dose unknown - Active. Fish Oil (Oral) Specific dose unknown - Active.  Past Surgical History Tonsillectomy Appendectomy Rotator Cuff Repair right Hernia Repair  Bilateral Lasix Procedures Bilateral  Review of Systems: General: No chills, fevers, night sweats, fatigue. Neuro:  No seizures, syncope, paralysis, visual problems. Respiratory:  No shortness of  breath, productive cough, hemoptysis. Cardiovascular: No chest pain, angina, palpitations, orthopnea. GI: No nausea, vomiting, diarrhea, constipation. GU:  No dysuria, hematuria, discharge. Musculoskeletal:  Joint pain   Vitals  Weight: 211 lb Height: 71in Weight was reported by patient. Height was reported by patient. Body Surface Area: 2.16 m Body Mass Index: 29.43 kg/m  BP: 152/100 (Sitting, Left Arm, Standard)   Physical Exam General Mental Status -Alert, cooperative and good historian. General Appearance-pleasant, Not in acute distress. Orientation-Oriented X3. Build & Nutrition-Well nourished and Well developed.  Head and Neck Head-normocephalic, atraumatic . Neck Global Assessment - supple, no bruit auscultated on the right, no bruit auscultated on the left.  Eye Vision-Wears corrective lenses(readers). Pupil - Bilateral-Regular and Round. Motion - Bilateral-EOMI.  Chest and Lung Exam Auscultation Breath sounds - clear at anterior chest wall and clear at posterior chest wall. Adventitious sounds - No Adventitious sounds.  Cardiovascular Auscultation Rhythm - Regular rate and rhythm. Heart Sounds - S1 WNL and S2 WNL. Murmurs & Other Heart Sounds - Auscultation of the heart reveals - No Murmurs.  Abdomen Palpation/Percussion Tenderness - Abdomen is non-tender to palpation. Rigidity (guarding) - Abdomen is soft. Auscultation Auscultation of the abdomen reveals - Bowel sounds normal.  Male Genitourinary Note: Not done, not pertinent to present illness   Musculoskeletal Note: He is alert and oriented in no acute distress. He does have some discomfort with lumbar range of motion most notably over that SI joint. He is nontender over the right and left greater trochanteric bursa. No masses or tumors palpated over this area. The right hip he has normal painless range of motion in the left hip. Flexion to about 110 degrees, internal  rotation of 10 degrees, external rotation of 20, abduction 20. He does have significant discomfort with passive and active rotation of that hip. The knees are nontender to palpation. No effusion or instability noted about the knees. Distal pulses are 2+. Sensation and motor function are intact in the lower extremities.  RADIOGRAPHS: AP and lateral views of the lumbar spine shows that he does have some degenerative changes, disc spaces most notably at L4-5 and L5-S1 with some signs of spondylolisthesis at L5. He does have significant arthritic changes in that left hip. He is bone on bone and does have some osteophyte formation most notably in the inferior portion of that joint.  Assessment & Plan Primary osteoarthritis of left hip (M16.12)  Note:Plan is for a Left Total Hip Replacement -anterior approach by Dr. Lequita Halt.  Plan is to go home.  PCP - Dr. Aida Puffer  The patient does not have any contraindications and will receive TXA (tranexamic acid) prior to surgery.  No problems with anesthesia.  Avel Peace, PA-C

## 2014-02-04 NOTE — Transfer of Care (Signed)
Immediate Anesthesia Transfer of Care Note  Patient: Steve Murphy  Procedure(s) Performed: Procedure(s): LEFT TOTAL HIP ARTHROPLASTY ANTERIOR APPROACH (Left)  Patient Location: PACU  Anesthesia Type:General  Level of Consciousness: awake, alert , oriented and patient cooperative  Airway & Oxygen Therapy: Patient Spontanous Breathing and Patient connected to nasal cannula oxygen  Post-op Assessment: Report given to PACU RN, Post -op Vital signs reviewed and stable and Patient moving all extremities  Post vital signs: Reviewed and stable  Complications: No apparent anesthesia complications

## 2014-02-04 NOTE — Evaluation (Signed)
Physical Therapy Evaluation Patient Details Name: Steve Murphy MRN: [id number] DOB: March 06, 1950 Today's Date: 02/04/2014   History of Present Illness  64 y.o. male [facility] for elective Left direct anterior THA.  He has significant PMHx of HTN and R shoulder arthroscopy.    Clinical Impression  Pt is POD #0 and is mobilizing well with min assist and RW to the recliner chair. He was mildly lightheaded in standing, so further gait not pushed this evening. I anticipate he will progress well and be able to go home with wife and HHPT at d/c.   PT to follow acutely for deficits listed below.       Follow Up Recommendations Home health PT    Equipment Recommendations  Rolling walker with 5" wheels;3in1 (PT) (plan to borrow a RW?)    Recommendations for Other Services   NA     Precautions / Restrictions Restrictions Weight Bearing Restrictions: Yes LLE Weight Bearing: Weight bearing as tolerated      Mobility  Bed Mobility Overal bed mobility: Needs Assistance Bed Mobility: Supine to Sit     Supine to sit: Min assist     General bed mobility comments: Min assist to help progress left leg to EOB.  Verbal cues for 1/2 bridge technique and pt using hands for leverage on bed rail.   Transfers Overall transfer level: Needs assistance Equipment used: Rolling walker (2 wheeled) Transfers: Sit to/from Stand Sit to Stand: Min guard         General transfer comment: Min guard assist for safety during transitions.   Ambulation/Gait Ambulation/Gait assistance: Min guard Ambulation Distance (Feet): 10 Feet Assistive device: Rolling walker (2 wheeled) Gait Pattern/deviations: Step-to pattern;Antalgic     General Gait Details: Pt with moderately antalgic gait pattern. Verbal cues needed for safe LE sequencing and RW use.  Pt mildly lightheaded in standing, so did not push for further gait.         Balance Overall balance assessment: Needs  assistance Sitting-balance support: Feet supported;No upper extremity supported Sitting balance-Leahy Scale: Good     Standing balance support: Bilateral upper extremity supported Standing balance-Leahy Scale: Fair                               Pertinent Vitals/Pain Pain Assessment: 0-10 Pain Score: 3  Pain Location: left hip Pain Descriptors / Indicators: Aching;Burning Pain Intervention(s): Limited activity within patient's tolerance;Premedicated before session;Monitored during session;Repositioned    Home Living Family/patient expects to be discharged to:: Private residence Living Arrangements: Spouse/significant other Available Help at Discharge: Family Type of Home: House Home Access: Stairs to enter Entrance Stairs-Rails: Right;Left (cannot reach both) Entrance Stairs-Number of Steps: 7-8 Home Layout: One level Home Equipment: None Additional Comments: 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 Very active at baseline.      Hand Dominance   Dominant Hand: Left    Extremity/Trunk Assessment   Upper Extremity Assessment: Defer to OT evaluation           Lower Extremity Assessment: LLE deficits/detail   LLE Deficits / Details: left leg with normal post op pain and weakness.  Ankle 4/5, knee 3-/5, hip 2+/5  Cervical / Trunk Assessment: Normal  Communication   Communication: No difficulties  Cognition Arousal/Alertness: Awake/alert Behavior During Therapy: WFL for tasks assessed/performed Overall Cognitive Status: Within Functional Limits for tasks assessed                      General Comments General comments (skin integrity, edema, etc.): exercises preformed as warm up prior to getting up OOB.     Exercises Total Joint Exercises Ankle Circles/Pumps:  AROM;Both;20 reps;Supine Quad Sets: AROM;Left;10 reps;Supine Heel Slides: AAROM;Left;10 reps;Supine Hip ABduction/ADduction: AAROM;Left;10 reps;Supine      Assessment/Plan    PT Assessment Patient needs continued PT services  PT Diagnosis Difficulty walking;Abnormality of gait;Generalized weakness;Acute pain   PT Problem List Decreased strength;Decreased range of motion;Decreased activity tolerance;Decreased balance;Decreased mobility;Decreased knowledge of use of DME;Pain  PT Treatment Interventions DME instruction;Gait training;Stair training;Functional mobility training;Therapeutic activities;Therapeutic exercise;Neuromuscular re-education;Balance training;Patient/family education;Manual techniques;Modalities   PT Goals (Current goals can be found in the Care Plan section) Acute Rehab PT Goals Patient 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 Jamira Barfuss, PT, DPT [phone]   02/04/2014, 4:00 PM

## 2014-02-04 NOTE — Plan of Care (Signed)
Problem: Consults Goal: Diagnosis- Total Joint Replacement Primary Total Hip     

## 2014-02-04 NOTE — Anesthesia Postprocedure Evaluation (Signed)
  Anesthesia Post-op Note  Patient: Steve Spillerharles E Sprenkle  Procedure(s) Performed: Procedure(s): LEFT TOTAL HIP ARTHROPLASTY ANTERIOR APPROACH (Left)  Patient Location: PACU  Anesthesia Type:General  Level of Consciousness: awake and alert   Airway and Oxygen Therapy: Patient Spontanous Breathing and Patient connected to nasal cannula oxygen  Post-op Pain: mild  Post-op Assessment: Post-op Vital signs reviewed and Patient's Cardiovascular Status Stable  Post-op Vital Signs: Reviewed and stable  Last Vitals:  Filed Vitals:   02/04/14 1230  BP: 155/85  Pulse: 76  Temp:   Resp: 11    Complications: No apparent anesthesia complications

## 2014-02-05 LAB — CBC
HEMATOCRIT: 37.2 % — AB (ref 39.0–52.0)
Hemoglobin: 12.8 g/dL — ABNORMAL LOW (ref 13.0–17.0)
MCH: 31.1 pg (ref 26.0–34.0)
MCHC: 34.4 g/dL (ref 30.0–36.0)
MCV: 90.5 fL (ref 78.0–100.0)
PLATELETS: 287 10*3/uL (ref 150–400)
RBC: 4.11 MIL/uL — AB (ref 4.22–5.81)
RDW: 12.8 % (ref 11.5–15.5)
WBC: 14.7 10*3/uL — AB (ref 4.0–10.5)

## 2014-02-05 LAB — BASIC METABOLIC PANEL
Anion gap: 7 (ref 5–15)
BUN: 12 mg/dL (ref 6–23)
CALCIUM: 8.2 mg/dL — AB (ref 8.4–10.5)
CHLORIDE: 104 mmol/L (ref 96–112)
CO2: 26 mmol/L (ref 19–32)
CREATININE: 1.35 mg/dL (ref 0.50–1.35)
GFR calc Af Amer: 63 mL/min — ABNORMAL LOW (ref >90)
GFR, EST NON AFRICAN AMERICAN: 54 mL/min — AB (ref >90)
GLUCOSE: 129 mg/dL — AB (ref 70–99)
POTASSIUM: 3.8 mmol/L (ref 3.5–5.1)
Sodium: 137 mmol/L (ref 135–145)

## 2014-02-05 NOTE — Discharge Instructions (Addendum)
°Dr. Frank Aluisio °Total Joint Specialist °[facility] °[address] °[place], [place] [address] °[phone] ° ° ° °ANTERIOR APPROACH TOTAL HIP REPLACEMENT POSTOPERATIVE DIRECTIONS ° ° °Hip Rehabilitation, Guidelines Following Surgery  °The results of a hip operation are greatly improved after range of motion and muscle strengthening exercises. Follow all safety measures which are given to protect your hip. If any of these exercises cause increased pain or swelling in your joint, decrease the amount until you are comfortable again. Then slowly increase the exercises. Call your caregiver if you have problems or questions.  °HOME CARE INSTRUCTIONS  °Most of the following instructions are designed to prevent the dislocation of your new hip.  °Remove items at home which could result in a fall. This includes throw rugs or furniture in walking pathways.  °Continue medications as instructed at time of discharge. °· You may have some home medications which will be placed on hold until you complete the course of blood thinner medication. °· You may start showering once you are discharged home but do not submerge the incision under water. Just pat the incision dry and apply a dry gauze dressing on daily. °Do not put on socks or shoes without following the instructions of your caregivers.  °Sit on high chairs which makes it easier to stand.  °Sit on chairs with arms. Use the chair arms to help push yourself up when arising.  °Keep your leg on the side of the operation out in front of you when standing up.  °Arrange for the use of a toilet seat elevator so you are not sitting low.   °· Walk with walker as instructed.  °You may resume a sexual relationship in one month or when given the OK by your caregiver.  °Use walker as long as suggested by your caregivers.  °You may put full weight on your legs and walk as much as is comfortable. °Avoid periods of inactivity such as sitting longer than an hour  when not asleep. This helps prevent blood clots.  °You may return to work once you are cleared by your surgeon.  °Do not drive a car for 6 weeks or until released by your surgeon.  °Do not drive while taking narcotics.  °Wear elastic stockings for three weeks following surgery during the day but you may remove then at night.  °Make sure you keep all of your appointments after your operation with all of your doctors and caregivers. You should call the office at the above phone number and make an appointment for approximately two weeks after the date of your surgery. °Change the dressing daily and reapply a dry dressing each time. °Please pick up a stool softener and laxative for home use as long as you are requiring pain medications. °· ICE to the affected hip every three hours for 30 minutes at a time and then as needed for pain and swelling.  Continue to use ice on the hip for pain and swelling from surgery. You may notice swelling that will progress down to the foot and ankle.  This is normal after surgery.  Elevate the leg when you are not up walking on it.   °It is important for you to complete the blood thinner medication as prescribed by your doctor. °· Continue to use the breathing machine which will help keep your temperature down.  It is common for your temperature to cycle up and down following surgery, especially at night when you are not up moving around   and exerting yourself.  The breathing machine keeps your lungs expanded and your temperature down. ° °RANGE OF MOTION AND STRENGTHENING EXERCISES  °These exercises are designed to help you keep full movement of your hip joint. Follow your caregiver's or physical therapist's instructions. Perform all exercises about fifteen times, three times per day or as directed. Exercise both hips, even if you have had only one joint replacement. These exercises can be done on a training (exercise) mat, on the floor, on a table or on a bed. Use whatever works the best  and is most comfortable for you. Use music or television while you are exercising so that the exercises are a pleasant break in your day. This will make your life better with the exercises acting as a break in routine you can look forward to.  °Lying on your back, slowly slide your foot toward your buttocks, raising your knee up off the floor. Then slowly slide your foot back down until your leg is straight again.  °Lying on your back spread your legs as far apart as you can without causing discomfort.  °Lying on your side, raise your upper leg and foot straight up from the floor as far as is comfortable. Slowly lower the leg and repeat.  °Lying on your back, tighten up the muscle in the front of your thigh (quadriceps muscles). You can do this by keeping your leg straight and trying to raise your heel off the floor. This helps strengthen the largest muscle supporting your knee.  °Lying on your back, tighten up the muscles of your buttocks both with the legs straight and with the knee bent at a comfortable angle while keeping your heel on the floor.  ° °SKILLED REHAB INSTRUCTIONS: °If the patient is transferred to a skilled rehab facility following release from the hospital, a list of the current medications will be sent to the facility for the patient to continue.  When discharged from the skilled rehab facility, please have the facility set up the patient's Home Health Physical Therapy prior to being released. Also, the skilled facility will be responsible for providing the patient with their medications at time of release from the facility to include their pain medication, the muscle relaxants, and their blood thinner medication. If the patient is still at the rehab facility at time of the two week follow up appointment, the skilled rehab facility will also need to assist the patient in arranging follow up appointment in our office and any transportation needs. ° °MAKE SURE YOU:  °Understand these instructions.    °Will watch your condition.  °Will get help right away if you are not doing well or get worse. ° °Pick up stool softner and laxative for home use following surgery while on pain medications. °Do not submerge incision under water. °Please use good hand washing techniques while changing dressing each day. °May shower starting three days after surgery. °Please use a clean towel to pat the incision dry following showers. °Continue to use ice for pain and swelling after surgery. °Do not use any lotions or creams on the incision until instructed by your surgeon. °Total Hip Protocol. ° °Take Xarelto for two and a half more weeks, then discontinue Xarelto. °Once the patient has completed the blood thinner regimen, then take a Baby 81 mg Aspirin daily for three more weeks. ° °Postoperative Constipation Protocol ° °Constipation - defined medically as fewer than three stools per week and severe constipation as less than one stool per week. ° °  One of the most common issues patients have following surgery is constipation.  Even if you have a regular bowel pattern at home, your normal regimen is likely to be disrupted due to multiple reasons following surgery.  Combination of anesthesia, postoperative narcotics, change in appetite and fluid intake all can affect your bowels.  In order to avoid complications following surgery, here are some recommendations in order to help you during your recovery period. ° °Colace (docusate) - Pick up an over-the-counter form of Colace or another stool softener and take twice a day as long as you are requiring postoperative pain medications.  Take with a full glass of water daily.  If you experience loose stools or diarrhea, hold the colace until you stool forms back up.  If your symptoms do not get better within 1 week or if they get worse, check with your doctor. ° °Dulcolax (bisacodyl) - Pick up over-the-counter and take as directed by the product packaging as needed to assist with the  movement of your bowels.  Take with a full glass of water.  Use this product as needed if not relieved by Colace only.  ° °MiraLax (polyethylene glycol) - Pick up over-the-counter to have on hand.  MiraLax is a solution that will increase the amount of water in your bowels to assist with bowel movements.  Take as directed and can mix with a glass of water, juice, soda, coffee, or tea.  Take if you go more than two days without a movement. °Do not use MiraLax more than once per day. Call your doctor if you are still constipated or irregular after using this medication for 7 days in a row. ° °If you continue to have problems with postoperative constipation, please contact the office for further assistance and recommendations.  If you experience "the worst abdominal pain ever" or develop nausea or vomiting, please contact the office immediatly for further recommendations for treatment. ° ° ° °Information on my medicine - XARELTO® (Rivaroxaban) ° °This medication education was reviewed with me or my healthcare representative as part of my discharge preparation.  The pharmacist that spoke with me during my hospital stay was:  Egan, Theresa Donovan, RPH ° °Why was Xarelto® prescribed for you? °Xarelto® was prescribed for you to reduce the risk of blood clots forming after orthopedic surgery. The medical term for these abnormal blood clots is venous thromboembolism (VTE). ° °What do you need to know about xarelto® ? °Take your Xarelto® ONCE DAILY at the same time every day. °You may take it either with or without food. ° °If you have difficulty swallowing the tablet whole, you may crush it and mix in applesauce just prior to taking your dose. ° °Take Xarelto® exactly as prescribed by your doctor and DO NOT stop taking Xarelto® without talking to the doctor who prescribed the medication.  Stopping without other VTE prevention medication to take the place of Xarelto® may increase your risk of developing a clot. ° °After  discharge, you should have regular check-up appointments with your healthcare provider that is prescribing your Xarelto®.   ° °What do you do if you miss a dose? °If you miss a dose, take it as soon as you remember on the same day then continue your regularly scheduled once daily regimen the next day. Do not take two doses of Xarelto® on the same day.  ° °Important Safety Information °A possible side effect of Xarelto® is bleeding. You should call your healthcare provider right away if you   experience any of the following: °? Bleeding from an injury or your nose that does not stop. °? Unusual colored urine (red or dark brown) or unusual colored stools (red or black). °? Unusual bruising for unknown reasons. °? A serious fall or if you hit your head (even if there is no bleeding). ° °Some medicines may interact with Xarelto® and might increase your risk of bleeding while on Xarelto®. To help avoid this, consult your healthcare provider or pharmacist prior to using any new prescription or non-prescription medications, including herbals, vitamins, non-steroidal anti-inflammatory drugs (NSAIDs) and supplements. ° °This website has more information on Xarelto®: www.xarelto.com. ° ° °

## 2014-02-05 NOTE — Progress Notes (Signed)
Physical Therapy Treatment Patient Details Name: Steve Murphy MRN: [id number] DOB: 12-03-1950 Today's Date: 02/05/2014    History of Present Illness 64 y.o. male [facility] for elective Left direct anterior THA.  He has significant PMHx of HTN and R shoulder arthroscopy.      PT Comments    Pt was seen for evaluation of his current status and is demonstrating some good tolerancefor gait and stairs today.  Has minimal pain with activity and is in some discomfort at rest.  Movement was helpful for managing discomfort.  HHPT planned at discharge.  Follow Up Recommendations  Home health PT     Equipment Recommendations  Rolling walker with 5" wheels;3in1 (PT)    Recommendations for Other Services       Precautions / Restrictions Restrictions Weight Bearing Restrictions: Yes LLE Weight Bearing: Weight bearing as tolerated    Mobility  Bed Mobility Overal bed mobility: Needs Assistance Bed Mobility: Supine to Sit     Supine to sit: Min assist        Transfers Overall transfer level: Needs assistance Equipment used: Rolling walker (2 wheeled) Transfers: Sit to/from UGI CorporationStand;Stand Pivot Transfers Sit to Stand: Min guard Stand pivot transfers: Min guard       General transfer comment: Min guard assist for safety during transitions.   Ambulation/Gait Ambulation/Gait assistance: Min guard Ambulation Distance (Feet): 200 Feet Assistive device: Rolling walker (2 wheeled) Gait Pattern/deviations: Step-through pattern;Decreased dorsiflexion - left;Decreased weight shift to left;Trunk flexed;Wide base of support Gait velocity: slow       Stairs Stairs: Yes Stairs assistance: Min guard Stair Management: Two rails;Step to pattern Number of Stairs: 10 General stair comments: Pt states he only has one rail and PT and he discussed how to use one rail but may benefit from practice tomorrow  Wheelchair Mobility    Modified Rankin (Stroke Patients Only)       Balance Overall balance assessment: Needs assistance Sitting-balance support: Feet supported Sitting balance-Leahy Scale: Good   Postural control: Posterior lean Standing balance support: Bilateral upper extremity supported Standing balance-Leahy Scale: Fair                      Cognition Arousal/Alertness: Awake/alert Behavior During Therapy: WFL for tasks assessed/performed Overall Cognitive Status: Within Functional Limits for tasks assessed                      Exercises Total Joint Exercises Ankle Circles/Pumps: AROM;Both;10 reps Quad Sets: AROM;Both;10 reps Gluteal Sets: AROM;Both;10 reps Towel Squeeze: AROM;Both;10 reps Hip ABduction/ADduction: AAROM;Left;10 reps;Supine    General Comments General comments (skin integrity, edema, etc.): Pt is leaving tomorrow and discussion about equipment was to add 3 in one and has a shower bench already      Pertinent Vitals/Pain Pain Assessment: 0-10 Pain Score: 7  Pain Location: L hip Pain Intervention(s): Limited activity within patient's tolerance;Monitored during session;Premedicated before session;Repositioned    Home Living                      Prior Function            PT Goals (current goals can now be found in the care plan section) Acute Rehab PT Goals 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 Number:  [phone]

## 2014-02-05 NOTE — Evaluation (Signed)
Occupational Therapy Evaluation and Discharge Patient Details Name: Steve Murphy MRN: [id number] DOB: 11/21/1950 Today's Date: 02/05/2014    History of Present Illness 64 y.o. male admitted to [facility] on 02/04/14 for elective Left direct anterior THA.  He has significant PMHx of HTN and R shoulder arthroscopy.     Clinical Impression   PTA pt lived at home and was independent with ADLs. Pt is very active and is eager to return to an active lifestyle. Pt and wife completed education and training for ADLs and functional mobility. Pt is progressing to Supervision level with functional mobility and reports that wife will assist with LB ADLs. No further acute OT needs.     Follow Up Recommendations  No OT follow up;Supervision/Assistance - 24 hour    Equipment Recommendations  3 in 1 bedside comode    Recommendations for Other Services       Precautions / Restrictions Precautions Precautions: None Restrictions Weight Bearing Restrictions: Yes LLE Weight Bearing: Weight bearing as tolerated      Mobility Bed Mobility Overal bed mobility: Needs Assistance Bed Mobility: Supine to Sit     Supine to sit: Min assist     General bed mobility comments: Pt sitting up in recliner when OT arrived  Transfers Overall transfer level: Needs assistance Equipment used: Rolling walker (2 wheeled) Transfers: Sit to/from Stand Sit to Stand: Supervision Stand pivot transfers: Min guard       General transfer comment: Good hand placement. Supervision for safety.     Balance Overall balance assessment: Needs assistance Sitting-balance support: Feet supported Sitting balance-Leahy Scale: Good   Postural control: Posterior lean Standing balance support: Bilateral upper extremity supported Standing balance-Leahy Scale: Fair                              ADL Overall ADL's : Needs assistance/impaired Eating/Feeding: Independent;Sitting   Grooming: Min guard;Standing    Upper Body Bathing: Set up;Sitting   Lower Body Bathing: Minimal assistance;Sit to/from stand   Upper Body Dressing : Set up;Sitting   Lower Body Dressing: Minimal assistance;Sit to/from stand   Toilet Transfer: Supervision/safety;Ambulation;RW Toilet Transfer Details (indicate cue type and reason): sit<>stand from recliner       Tub/Shower Transfer Details (indicate cue type and reason): Educated pt on safe tub transfer technique. Advised pt to wait until he is able to stand without UE for several minutes before attempting tub, as pt will not be able to fit 3N1 inside tub with door.  Functional mobility during ADLs: Supervision/safety;Rolling walker       Vision  Pt reports no change from baseline.                    Perception Perception Perception Tested?: No   Praxis Praxis Praxis tested?: Within functional limits    Pertinent Vitals/Pain Pain Assessment: 0-10 Pain Score: 2  Pain Location: L hip Pain Descriptors / Indicators: Aching Pain Intervention(s): Repositioned;Monitored during session     Hand Dominance Left   Extremity/Trunk Assessment Upper Extremity Assessment Upper Extremity Assessment: Overall WFL for tasks assessed   Lower Extremity Assessment Lower Extremity Assessment: Defer to PT evaluation   Cervical / Trunk Assessment Cervical / Trunk Assessment: Normal   Communication Communication Communication: No difficulties   Cognition Arousal/Alertness: Awake/alert Behavior During Therapy: WFL for tasks assessed/performed Overall Cognitive Status: Within Functional Limits for tasks assessed  Home Living Family/patient expects to be discharged to:: Private residence Living Arrangements: Spouse/significant other Available Help at Discharge: Family Type of Home: House Home Access: Stairs to enter Entergy CorporationEntrance Stairs-Number of Steps: 7-8 Entrance Stairs-Rails: Right;Left Home Layout: One level      Bathroom Shower/Tub: Tub/shower unit;Door Shower/tub characteristics: Sport and exercise psychologistDoor Bathroom Toilet: Standard     Home Equipment: None   Additional Comments: 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, doesn't work on cars) and [place]      Prior Functioning/Environment Level of Independence: Independent        Comments: Very active at baseline    OT Diagnosis: Generalized weakness;Acute pain         OT Goals(Current goals can be found in the care plan section) Acute Rehab OT Goals 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

## 2014-02-05 NOTE — Progress Notes (Signed)
    Subjective: 1 Day Post-Op Procedure(s) (LRB): LEFT TOTAL HIP ARTHROPLASTY ANTERIOR APPROACH (Left) Patient reports pain as 3 on 0-10 scale.   Denies CP or SOB.  Voiding without difficulty. Positive flatus. Objective: Vital signs in last 24 hours: Temp:  [97.7 F (36.5 C)-99.5 F (37.5 C)] 98.6 F (37 C) (01/23 0555) Pulse Rate:  [62-83] 73 (01/23 0555) Resp:  [6-18] 15 (01/23 0555) BP: (111-155)/(63-91) 132/70 mmHg (01/23 0555) SpO2:  [95 %-100 %] 95 % (01/23 0555) Weight:  [95.255 kg (210 lb)] 95.255 kg (210 lb) (01/22 2300)  Intake/Output from previous day: 01/22 0701 - 01/23 0700 In: 3970 [P.O.:870; I.V.:3000; IV Piggyback:100] Out: 3830 [Urine:3250; Drains:200; Blood:380] Intake/Output this shift:    Labs:  Recent Labs  02/05/14 0342  HGB 12.8*    Recent Labs  02/05/14 0342  WBC 14.7*  RBC 4.11*  HCT 37.2*  PLT 287    Recent Labs  02/05/14 0342  NA 137  K 3.8  CL 104  CO2 26  BUN 12  CREATININE 1.35  GLUCOSE 129*  CALCIUM 8.2*   No results for input(s): LABPT, INR in the last 72 hours.  Physical Exam: Neurologically intact ABD soft Intact pulses distally Compartment soft  Assessment/Plan: 1 Day Post-Op Procedure(s) (LRB): LEFT TOTAL HIP ARTHROPLASTY ANTERIOR APPROACH (Left) Advance diet Up with therapy D/C IV fluids  Monitor WBC - most likely secondary to stress of surgery. NO signs/symptoms of infection   Steve Murphy D for Steve Murphy [facility] [phone] 02/05/2014, 8:21 AM

## 2014-02-06 LAB — CBC
HEMATOCRIT: 36.6 % — AB (ref 39.0–52.0)
HEMOGLOBIN: 12.3 g/dL — AB (ref 13.0–17.0)
MCH: 30.8 pg (ref 26.0–34.0)
MCHC: 33.6 g/dL (ref 30.0–36.0)
MCV: 91.5 fL (ref 78.0–100.0)
Platelets: 231 10*3/uL (ref 150–400)
RBC: 4 MIL/uL — AB (ref 4.22–5.81)
RDW: 12.9 % (ref 11.5–15.5)
WBC: 12.4 10*3/uL — ABNORMAL HIGH (ref 4.0–10.5)

## 2014-02-06 LAB — BASIC METABOLIC PANEL
ANION GAP: 7 (ref 5–15)
BUN: 12 mg/dL (ref 6–23)
CALCIUM: 8.2 mg/dL — AB (ref 8.4–10.5)
CO2: 28 mmol/L (ref 19–32)
Chloride: 104 mmol/L (ref 96–112)
Creatinine, Ser: 1.1 mg/dL (ref 0.50–1.35)
GFR calc Af Amer: 80 mL/min — ABNORMAL LOW (ref >90)
GFR calc non Af Amer: 69 mL/min — ABNORMAL LOW (ref >90)
Glucose, Bld: 111 mg/dL — ABNORMAL HIGH (ref 70–99)
POTASSIUM: 3.8 mmol/L (ref 3.5–5.1)
Sodium: 139 mmol/L (ref 135–145)

## 2014-02-06 MED ORDER — METHOCARBAMOL 500 MG PO TABS: 500.0000 mg | ORAL_TABLET | Freq: Four times a day (QID) | ORAL | Status: DC | PRN

## 2014-02-06 MED ORDER — OXYCODONE HCL 5 MG PO TABS: 5.0000 mg | ORAL_TABLET | ORAL | Status: DC | PRN

## 2014-02-06 MED ORDER — TRAMADOL HCL 50 MG PO TABS: 50.0000 mg | ORAL_TABLET | Freq: Four times a day (QID) | ORAL | Status: DC | PRN

## 2014-02-06 MED ORDER — RIVAROXABAN 10 MG PO TABS: 10.0000 mg | ORAL_TABLET | Freq: Every day | ORAL | Status: DC

## 2014-02-06 NOTE — Discharge Summary (Signed)
Physician Discharge Summary   Patient ID: JIE STICKELS MRN: [id number] DOB/AGE: [birth year] 65 y.o.  Admit date: 02/04/2014 Discharge date: 02/06/2014  Primary Diagnosis:  Osteoarthritis of the Left hip.  Admission Diagnoses:  Past Medical History  Diagnosis Date  . Arthritis     L hip   . Hypertension     pt. runs a tad hypertensive, states that he "turned down" medicine ( RX ) from PCP    Discharge Diagnoses:   Principal Problem:   OA (osteoarthritis) of hip  Estimated body mass index is 29.3 kg/(m^2) as calculated from the following:   Height as of this encounter: _0  (1.803 m).   Weight as of this encounter: 95.255 kg (210 lb).  Procedure(s) (LRB): LEFT TOTAL HIP ARTHROPLASTY ANTERIOR APPROACH (Left)   Consults: None  HPI: Steve Murphy is a 64 y.o. male who has advanced end-  stage arthritis of his Left hip with progressively worsening pain and  dysfunction.The patient has failed nonoperative management and presents for  total hip arthroplasty.  Laboratory Data: Admission on 02/04/2014, Discharged on 02/06/2014  Component Date Value Ref Range Status  . WBC 02/05/2014 14.7* 4.0 - 10.5 K/uL Final  . RBC 02/05/2014 4.11* 4.22 - 5.81 MIL/uL Final  . Hemoglobin 02/05/2014 12.8* 13.0 - 17.0 g/dL Final  . HCT 02/05/2014 37.2* 39.0 - 52.0 % Final  . MCV 02/05/2014 90.5  78.0 - 100.0 fL Final  . MCH 02/05/2014 31.1  26.0 - 34.0 pg Final  . MCHC 02/05/2014 34.4  30.0 - 36.0 g/dL Final  . RDW 02/05/2014 12.8  11.5 - 15.5 % Final  . Platelets 02/05/2014 287  150 - 400 K/uL Final  . Sodium 02/05/2014 137  135 - 145 mmol/L Final  . Potassium 02/05/2014 3.8  3.5 - 5.1 mmol/L Final  . Chloride 02/05/2014 104  96 - 112 mmol/L Final  . CO2 02/05/2014 26  19 - 32 mmol/L Final  . Glucose, Bld 02/05/2014 129* 70 - 99 mg/dL Final  . BUN 02/05/2014 12  6 - 23 mg/dL Final  . Creatinine, Ser 02/05/2014 1.35  0.50 - 1.35 mg/dL Final  . Calcium 02/05/2014 8.2* 8.4 - 10.5  mg/dL Final  . GFR calc non Af Amer 02/05/2014 54* >90 mL/min Final  . GFR calc Af Amer 02/05/2014 63* >90 mL/min Final   Comment: (NOTE) The eGFR has been calculated using the CKD EPI equation. This calculation has not been validated in all clinical situations. eGFR's persistently <90 mL/min signify possible Chronic Kidney Disease.   . Anion gap 02/05/2014 7  5 - 15 Final  . WBC 02/06/2014 12.4* 4.0 - 10.5 K/uL Final  . RBC 02/06/2014 4.00* 4.22 - 5.81 MIL/uL Final  . Hemoglobin 02/06/2014 12.3* 13.0 - 17.0 g/dL Final  . HCT 02/06/2014 36.6* 39.0 - 52.0 % Final  . MCV 02/06/2014 91.5  78.0 - 100.0 fL Final  . MCH 02/06/2014 30.8  26.0 - 34.0 pg Final  . MCHC 02/06/2014 33.6  30.0 - 36.0 g/dL Final  . RDW 02/06/2014 12.9  11.5 - 15.5 % Final  . Platelets 02/06/2014 231  150 - 400 K/uL Final  . Sodium 02/06/2014 139  135 - 145 mmol/L Final  . Potassium 02/06/2014 3.8  3.5 - 5.1 mmol/L Final  . Chloride 02/06/2014 104  96 - 112 mmol/L Final  . CO2 02/06/2014 28  19 - 32 mmol/L Final  . Glucose, Bld 02/06/2014 111* 70 - 99 mg/dL Final  . BUN  02/06/2014 12  6 - 23 mg/dL Final  . Creatinine, Ser 02/06/2014 1.10  0.50 - 1.35 mg/dL Final  . Calcium 02/06/2014 8.2* 8.4 - 10.5 mg/dL Final  . GFR calc non Af Amer 02/06/2014 69* >90 mL/min Final  . GFR calc Af Amer 02/06/2014 80* >90 mL/min Final   Comment: (NOTE) The eGFR has been calculated using the CKD EPI equation. This calculation has not been validated in all clinical situations. eGFR's persistently <90 mL/min signify possible Chronic Kidney Disease.   Georgiann Hahn gap 02/06/2014 7  5 - 15 Final  Hospital Outpatient Visit on 01/24/2014  Component Date Value Ref Range Status  . aPTT 01/24/2014 26  24 - 37 seconds Final  . WBC 01/24/2014 8.7  4.0 - 10.5 K/uL Final  . RBC 01/24/2014 5.26  4.22 - 5.81 MIL/uL Final  . Hemoglobin 01/24/2014 16.2  13.0 - 17.0 g/dL Final  . HCT 01/24/2014 47.9  39.0 - 52.0 % Final  . MCV 01/24/2014 91.1   78.0 - 100.0 fL Final  . MCH 01/24/2014 30.8  26.0 - 34.0 pg Final  . MCHC 01/24/2014 33.8  30.0 - 36.0 g/dL Final  . RDW 01/24/2014 13.1  11.5 - 15.5 % Final  . Platelets 01/24/2014 256  150 - 400 K/uL Final  . Sodium 01/24/2014 137  135 - 145 mmol/L Final   Please note change in reference range.  . Potassium 01/24/2014 4.3  3.5 - 5.1 mmol/L Final   Please note change in reference range.  . Chloride 01/24/2014 105  96 - 112 mEq/L Final  . CO2 01/24/2014 27  19 - 32 mmol/L Final  . Glucose, Bld 01/24/2014 97  70 - 99 mg/dL Final  . BUN 01/24/2014 21  6 - 23 mg/dL Final  . Creatinine, Ser 01/24/2014 1.21  0.50 - 1.35 mg/dL Final  . Calcium 01/24/2014 9.4  8.4 - 10.5 mg/dL Final  . Total Protein 01/24/2014 7.0  6.0 - 8.3 g/dL Final  . Albumin 01/24/2014 4.4  3.5 - 5.2 g/dL Final  . AST 01/24/2014 21  0 - 37 U/L Final  . ALT 01/24/2014 22  0 - 53 U/L Final  . Alkaline Phosphatase 01/24/2014 93  39 - 117 U/L Final  . Total Bilirubin 01/24/2014 0.9  0.3 - 1.2 mg/dL Final  . GFR calc non Af Amer 01/24/2014 62* >90 mL/min Final  . GFR calc Af Amer 01/24/2014 71* >90 mL/min Final   Comment: (NOTE) The eGFR has been calculated using the CKD EPI equation. This calculation has not been validated in all clinical situations. eGFR's persistently <90 mL/min signify possible Chronic Kidney Disease.   . Anion gap 01/24/2014 5  5 - 15 Final  . Prothrombin Time 01/24/2014 13.1  11.6 - 15.2 seconds Final  . INR 01/24/2014 0.98  0.00 - 1.49 Final  . ABO/RH(D) 01/24/2014 B POS   Final  . Antibody Screen 01/24/2014 NEG   Final  . Sample Expiration 01/24/2014 02/07/2014   Final  . Color, Urine 01/24/2014 YELLOW  YELLOW Final  . APPearance 01/24/2014 CLEAR  CLEAR Final  . Specific Gravity, Urine 01/24/2014 1.017  1.005 - 1.030 Final  . pH 01/24/2014 6.0  5.0 - 8.0 Final  . Glucose, UA 01/24/2014 NEGATIVE  NEGATIVE mg/dL Final  . Hgb urine dipstick 01/24/2014 NEGATIVE  NEGATIVE Final  . Bilirubin  Urine 01/24/2014 NEGATIVE  NEGATIVE Final  . Ketones, ur 01/24/2014 NEGATIVE  NEGATIVE mg/dL Final  . Protein, ur 01/24/2014 NEGATIVE  NEGATIVE mg/dL Final  . Urobilinogen, UA 01/24/2014 0.2  0.0 - 1.0 mg/dL Final  . Nitrite 01/24/2014 NEGATIVE  NEGATIVE Final  . Leukocytes, UA 01/24/2014 NEGATIVE  NEGATIVE Final   MICROSCOPIC NOT DONE ON URINES WITH NEGATIVE PROTEIN, BLOOD, LEUKOCYTES, NITRITE, OR GLUCOSE <1000 mg/dL.  Marland Kitchen MRSA, PCR 01/24/2014 NEGATIVE  NEGATIVE Final  . Staphylococcus aureus 01/24/2014 POSITIVE* NEGATIVE Final   Comment:        The Xpert SA Assay (FDA approved for NASAL specimens in patients over 58 years of age), is one component of a comprehensive surveillance program.  Test performance has been validated by [employer] for patients greater than or equal to 9 year old. It is not intended to diagnose infection nor to guide or monitor treatment.   . ABO/RH(D) 01/24/2014 B POS   Final     X-Rays:Dg Pelvis Portable  02/04/2014   CLINICAL DATA:  Post hip replacement  EXAM: PORTABLE PELVIS 1-2 VIEWS  COMPARISON:  Portable exam 8127 hr without priors for comparison.  FINDINGS: Acetabular and femoral components of a LEFT hip prosthesis are identified.  Tip of femoral component not imaged.  Bones appear demineralized.  No fracture or dislocation.  Surgical drain overlies surgical site.  Pelvis appears intact.  Narrowing of RIGHT hip joint.  IMPRESSION: LEFT hip prosthesis without acute complication.  Tip of femoral component not imaged.   Electronically Signed   By: Lavonia Dana M.D.   On: 02/04/2014 12:58   Dg C-arm 1-60 Min-no Report  02/04/2014   CLINICAL DATA: anterior hip surgery, left   C-ARM 1-60 MINUTES  Fluoroscopy was utilized by the requesting physician.  No radiographic  interpretation.     EKG: Orders placed or performed in visit on 01/24/14  . EKG 12-Lead     Hospital Course: Patient was admitted to [facility] and taken to the OR and underwent  the above state procedure without complications.  Patient tolerated the procedure well and was later transferred to the recovery room and then to the orthopaedic floor for postoperative care.  They were given PO and IV analgesics for pain control following their surgery.  They were given 24 hours of postoperative antibiotics of  Anti-infectives    Start     Dose/Rate Route Frequency Ordered Stop   02/04/14 1600  ceFAZolin (ANCEF) IVPB 2 g/50 mL premix     2 g100 mL/hr over 30 Minutes Intravenous Every 6 hours 02/04/14 1424 02/04/14 2158   02/04/14 0600  ceFAZolin (ANCEF) IVPB 2 g/50 mL premix     2 g100 mL/hr over 30 Minutes Intravenous On call to O.R. 02/03/14 1259 02/04/14 0954     and started on DVT prophylaxis in the form of Xarelto.   PT and OT were ordered for total hip protocol.  The patient was allowed to be WBAT with therapy. Discharge planning was consulted to help with postop disposition and equipment needs.  Patient had a decent night on the evening of surgery.  They started to get up OOB with therapy on day one.  Hemovac drain was pulled without difficulty.  Continued to work with therapy into day two.  Dressing was changed on day two and the incision was healing well. Patient was seen in rounds and was ready to go home.  Discharge home with home health Diet - Cardiac diet Follow up - in 2 weeks Activity - WBAT Disposition - Home Condition Upon Discharge - Good D/C Meds - See DC Summary DVT Prophylaxis -  Xarelto      Discharge Instructions    Call MD / Call 911    Complete by:  As directed   If you experience chest pain or shortness of breath, CALL 911 and be transported to the hospital emergency room.  If you develope a fever above 101 F, pus (white drainage) or increased drainage or redness at the wound, or calf pain, call your surgeon's office.     Change dressing    Complete by:  As directed   You may change your dressing dressing daily with sterile 4 x 4 inch gauze  dressing and paper tape.  Do not submerge the incision under water.     Constipation Prevention    Complete by:  As directed   Drink plenty of fluids.  Prune juice may be helpful.  You may use a stool softener, such as Colace (over the counter) 100 mg twice a day.  Use MiraLax (over the counter) for constipation as needed.     Diet - low sodium heart healthy    Complete by:  As directed      Discharge instructions    Complete by:  As directed   Pick up stool softner and laxative for home use following surgery while on pain medications. Do not submerge incision under water. Please use good hand washing techniques while changing dressing each day. May shower starting three days after surgery. Please use a clean towel to pat the incision dry following showers. Continue to use ice for pain and swelling after surgery. Do not use any lotions or creams on the incision until instructed by your surgeon.  Total Hip Protocol.  Take Xarelto for two and a half more weeks, then discontinue Xarelto. Once the patient has completed the blood thinner regimen, then take a Baby 81 mg Aspirin daily for three more weeks.  Postoperative Constipation Protocol  Constipation - defined medically as fewer than three stools per week and severe constipation as less than one stool per week.  One of the most common issues patients have following surgery is constipation.  Even if you have a regular bowel pattern at home, your normal regimen is likely to be disrupted due to multiple reasons following surgery.  Combination of anesthesia, postoperative narcotics, change in appetite and fluid intake all can affect your bowels.  In order to avoid complications following surgery, here are some recommendations in order to help you during your recovery period.  Colace (docusate) - Pick up an over-the-counter form of Colace or another stool softener and take twice a day as long as you are requiring postoperative pain medications.   Take with a full glass of water daily.  If you experience loose stools or diarrhea, hold the colace until you stool forms back up.  If your symptoms do not get better within 1 week or if they get worse, check with your doctor.  Dulcolax (bisacodyl) - Pick up over-the-counter and take as directed by the product packaging as needed to assist with the movement of your bowels.  Take with a full glass of water.  Use this product as needed if not relieved by Colace only.   MiraLax (polyethylene glycol) - Pick up over-the-counter to have on hand.  MiraLax is a solution that will increase the amount of water in your bowels to assist with bowel movements.  Take as directed and can mix with a glass of water, juice, soda, coffee, or tea.  Take if you go more than two days  without a movement. Do not use MiraLax more than once per day. Call your doctor if you are still constipated or irregular after using this medication for 7 days in a row.  If you continue to have problems with postoperative constipation, please contact the office for further assistance and recommendations.  If you experience "the worst abdominal pain ever" or develop nausea or vomiting, please contact the office immediatly for further recommendations for treatment.     Do not sit on low chairs, stoools or toilet seats, as it may be difficult to get up from low surfaces    Complete by:  As directed      Driving restrictions    Complete by:  As directed   No driving until released by the physician.     Increase activity slowly as tolerated    Complete by:  As directed      Lifting restrictions    Complete by:  As directed   No lifting until released by the physician.     Patient may shower    Complete by:  As directed   You may shower without a dressing once there is no drainage.  Do not wash over the wound.  If drainage remains, do not shower until drainage stops.     TED hose    Complete by:  As directed   Use stockings (TED hose) for 3  weeks on both leg(s).  You may remove them at night for sleeping.     Weight bearing as tolerated    Complete by:  As directed   Laterality:  left  Extremity:  Lower            Medication List    STOP taking these medications        cholecalciferol 1000 UNITS tablet  Commonly known as:  VITAMIN D     FISH OIL PO     multivitamin with minerals tablet     naproxen 500 MG tablet  Commonly known as:  NAPROSYN      TAKE these medications        acetaminophen 325 MG tablet  Commonly known as:  TYLENOL  Take 650 mg by mouth every 6 (six) hours as needed.     ACYCLOVIR PO  Take 1 tablet by mouth daily as needed.     bismuth subsalicylate 035 WS/56CL suspension  Commonly known as:  PEPTO BISMOL  Take 30 mLs by mouth daily as needed for indigestion.     methocarbamol 500 MG tablet  Commonly known as:  ROBAXIN  Take 1 tablet (500 mg total) by mouth every 6 (six) hours as needed for muscle spasms.     oxyCODONE 5 MG immediate release tablet  Commonly known as:  Oxy IR/ROXICODONE  Take 1-2 tablets (5-10 mg total) by mouth every 3 (three) hours as needed for moderate pain, severe pain or breakthrough pain.     rivaroxaban 10 MG Tabs tablet  Commonly known as:  XARELTO  - Take 1 tablet (10 mg total) by mouth daily with breakfast. Take Xarelto for two and a half more weeks, then discontinue Xarelto.  - Once the patient has completed the blood thinner regimen, then take a Baby 81 mg Aspirin daily for three more weeks.     traMADol 50 MG tablet  Commonly known as:  ULTRAM  Take 1-2 tablets (50-100 mg total) by mouth every 6 (six) hours as needed (mild pain).       Follow-up Information  Follow up with Gearlean Alf, MD. Schedule an appointment as soon as possible for a visit in 2 weeks.   Specialty:  Orthopedic Surgery   Why:  Call office at [phone] to set up appointment with Dr. Wynelle Link.   Contact information:   [address] [place]  [address] [phone]       Follow up with [facility].   Why:  HOME HEALTH PHYSICAL THERAPY   Contact information:   [address] [place] [address] [phone]       Follow up with [facility].   Why:  rolling walker and 3n1 (commode)   Contact information:   [place] [address] [phone]       Signed: Arlee Muslim, PA-C Orthopaedic Surgery 02/15/2014, 10:21 AM

## 2014-02-06 NOTE — Progress Notes (Signed)
   Subjective: 2 Days Post-Op Procedure(s) (LRB): LEFT TOTAL HIP ARTHROPLASTY ANTERIOR APPROACH (Left) Patient reports pain as mild.   Patient seen in rounds with Dr. Lequita HaltAluisio. Patient is well, and has had no acute complaints or problems Patient is ready to go home  Objective: Vital signs in last 24 hours: Temp:  [98.6 F (37 C)-99 F (37.2 C)] 98.8 F (37.1 C) (01/24 0626) Pulse Rate:  [74-79] 74 (01/24 0626) Resp:  [16] 16 (01/24 0626) BP: (128-147)/(67-82) 132/78 mmHg (01/24 0626) SpO2:  [96 %-99 %] 98 % (01/24 0626)  Intake/Output from previous day:  Intake/Output Summary (Last 24 hours) at 02/06/14 0834 Last data filed at 02/06/14 0826  Gross per 24 hour  Intake   1060 ml  Output   1600 ml  Net   -540 ml    Intake/Output this shift: Total I/O In: 240 [P.O.:240] Out: -   Labs:  Recent Labs  02/05/14 0342 02/06/14 0514  HGB 12.8* 12.3*    Recent Labs  02/05/14 0342 02/06/14 0514  WBC 14.7* 12.4*  RBC 4.11* 4.00*  HCT 37.2* 36.6*  PLT 287 231    Recent Labs  02/05/14 0342 02/06/14 0514  NA 137 139  K 3.8 3.8  CL 104 104  CO2 26 28  BUN 12 12  CREATININE 1.35 1.10  GLUCOSE 129* 111*  CALCIUM 8.2* 8.2*   No results for input(s): LABPT, INR in the last 72 hours.  EXAM: General - Patient is Alert, Appropriate and Oriented Extremity - Neurovascular intact Sensation intact distally Dorsiflexion/Plantar flexion intact Incision - clean, dry, no drainage, healing Motor Function - intact, moving foot and toes well on exam.   Assessment/Plan: 2 Days Post-Op Procedure(s) (LRB): LEFT TOTAL HIP ARTHROPLASTY ANTERIOR APPROACH (Left) Procedure(s) (LRB): LEFT TOTAL HIP ARTHROPLASTY ANTERIOR APPROACH (Left) Past Medical History  Diagnosis Date  . Arthritis     L hip   . Hypertension     pt. runs a tad hypertensive, states that he "turned down" medicine ( RX ) from PCP    Principal Problem:   OA (osteoarthritis) of hip  Estimated body mass  index is 29.3 kg/(m^2) as calculated from the following:   Height as of this encounter: 5\' 11"  (1.803 m).   Weight as of this encounter: 95.255 kg (210 lb). Discharge home with home health Diet - Cardiac diet Follow up - in 2 weeks Activity - WBAT Disposition - Home Condition Upon Discharge - Good D/C Meds - See DC Summary DVT Prophylaxis - Xarelto  Avel Peacerew Perkins, PA-C Orthopaedic Surgery 02/06/2014, 8:34 AM

## 2014-02-06 NOTE — Progress Notes (Signed)
Utilization Review Completed.   Lenoria Narine, RN, BSN Nurse Case Manager  

## 2014-02-06 NOTE — Progress Notes (Signed)
Physical Therapy Treatment Patient Details Name: Levonne SpillerCharles E Griffo MRN: [id number] DOB: May 31, 1950 Today's Date: 02/06/2014    History of Present Illness 64 y.o. male [facility] for elective Left direct anterior THA.  He has significant PMHx of HTN and R shoulder arthroscopy.      PT Comments    Patient doing well with mobility and gait.  Able to negotiate stairs with min guard assist.  Patient ready to d/c today from PT perspective.  Follow Up Recommendations  Home health PT     Equipment Recommendations  Rolling walker with 5" wheels;3in1 (PT)    Recommendations for Other Services       Precautions / Restrictions Precautions Precautions: None Restrictions Weight Bearing Restrictions: Yes LLE Weight Bearing: Weight bearing as tolerated    Mobility  Bed Mobility                  Transfers Overall transfer level: Needs assistance Equipment used: Rolling walker (2 wheeled) Transfers: Sit to/from Stand Sit to Stand: Supervision         General transfer comment: Good hand placement. Supervision for safety.   Ambulation/Gait Ambulation/Gait assistance: Supervision Ambulation Distance (Feet): 220 Feet Assistive device: Rolling walker (2 wheeled) Gait Pattern/deviations: Step-through pattern;Decreased stance time - left;Decreased step length - right;Decreased stride length;Decreased weight shift to left;Antalgic Gait velocity: Decreased Gait velocity interpretation: Below normal speed for age/gender General Gait Details: Patient demonstrates safe use of RW.  Patient with antalgic gait pattern.   Stairs Stairs: Yes Stairs assistance: Min guard Stair Management: One rail Right;Step to pattern;Forwards Number of Stairs: 5 General stair comments: Patient able to recall correct technique for negotiating stairs.  Use of 1 rail today - patient reaching rail with both hands.  Cues to keep Lt knee straight when stepping with Rt foot going up  steps.  Wheelchair Mobility    Modified Rankin (Stroke Patients Only)       Balance                                    Cognition Arousal/Alertness: Awake/alert Behavior During Therapy: WFL for tasks assessed/performed Overall Cognitive Status: Within Functional Limits for tasks assessed                      Exercises      General Comments        Pertinent Vitals/Pain Pain Assessment: 0-10 Pain Score: 6  (with ambulation) Pain Location: Lt hip Pain Descriptors / Indicators: Aching;Sore Pain Intervention(s): Monitored during session;Repositioned    Home Living                      Prior Function            PT Goals (current goals can now be found in the care plan section) Progress towards PT goals: Progressing toward goals    Frequency  7X/week    PT Plan Current plan remains appropriate    Co-evaluation             End of Session Equipment Utilized During Treatment: Gait belt Activity Tolerance: Patient limited by fatigue Patient left: in chair;with call bell/phone within reach     Time: [id number] PT Time Calculation (min) (ACUTE ONLY): 14 min  Charges:  $Gait Training: 8-22 mins  G Codes:      Vena Austria 02/06/2014, 7:08 PM Durenda Hurt. Renaldo Fiddler, [facility] Acute Rehab Services Pager [phone]

## 2014-02-06 NOTE — Care Management Note (Signed)
    Page 1 of 2   02/06/2014     2:06:39 PM CARE MANAGEMENT NOTE 02/06/2014  Patient:  Steve Murphy, Steve Murphy   Account Number:  [id number]  Date Initiated:  02/05/2014  Documentation initiated by:  [facility]  Subjective/Objective Assessment:   adm: LEFT TOTAL HIP ARTHROPLASTY ANTERIOR APPROACH (Left)     Action/Plan:   discharge planning   Anticipated DC Date:  02/06/2014   Anticipated DC Plan:  [name]  CM consult      [name]  HOME HEALTH   Choice offered to / List presented to:  [name]   DME arranged  [name]  [name]      DME agency  [name] arranged  [name]   Status of service:  Completed, signed off Medicare Important Message given?   (If response is "NO", the following Medicare IM given date fields will be blank) Date Medicare IM given:   Medicare IM given by:   Date Additional Medicare IM given:   Additional Medicare IM given by:    Discharge Disposition:  [name]  Per UR Regulation:    If discussed at Long Length of Stay Meetings, dates discussed:    Comments:  02/06/14 11:00 CM met with pt to offer choice of home health agency.  Pt chooses [name] to render HHPT. Address and contact information verified by pt with additional cell [phone]. Referral called to [name].  CM called [name], [name] to please deliver the 3n1 and rolling walker to room prior to discharge.  No other CM needs were communicated.  [name], BSN, CM [phone].

## 2014-02-07 ENCOUNTER — Encounter (HOSPITAL_COMMUNITY): Payer: Self-pay | Admitting: Orthopedic Surgery

## 2014-10-13 ENCOUNTER — Other Ambulatory Visit: Payer: Self-pay | Admitting: Family Medicine

## 2014-10-13 ENCOUNTER — Ambulatory Visit
Admission: RE | Admit: 2014-10-13 | Discharge: 2014-10-13 | Disposition: A | Discharge status: Home / Self Care | Payer: Medicare Other | Source: Ambulatory Visit | Attending: Family Medicine | Admitting: Family Medicine

## 2014-10-13 DIAGNOSIS — M542 Cervicalgia: Secondary | ICD-10-CM

## 2015-11-17 ENCOUNTER — Other Ambulatory Visit: Payer: Self-pay | Admitting: Family Medicine

## 2015-11-17 ENCOUNTER — Ambulatory Visit
Admission: RE | Admit: 2015-11-17 | Discharge: 2015-11-17 | Disposition: A | Discharge status: Home / Self Care | Payer: Medicare Other | Source: Ambulatory Visit | Attending: Family Medicine | Admitting: Family Medicine

## 2015-11-17 DIAGNOSIS — M1990 Unspecified osteoarthritis, unspecified site: Secondary | ICD-10-CM

## 2018-05-01 IMAGING — CR DG HIP (WITH OR WITHOUT PELVIS) 2-3V*R*
2 series · 2 of 2 positions shown · non-contrast
Comparison: None.

CLINICAL DATA: Increased right hip pain for 2 years.

EXAM:
DG HIP (WITH OR WITHOUT PELVIS) 2-3V RIGHT

[w hip ap right]
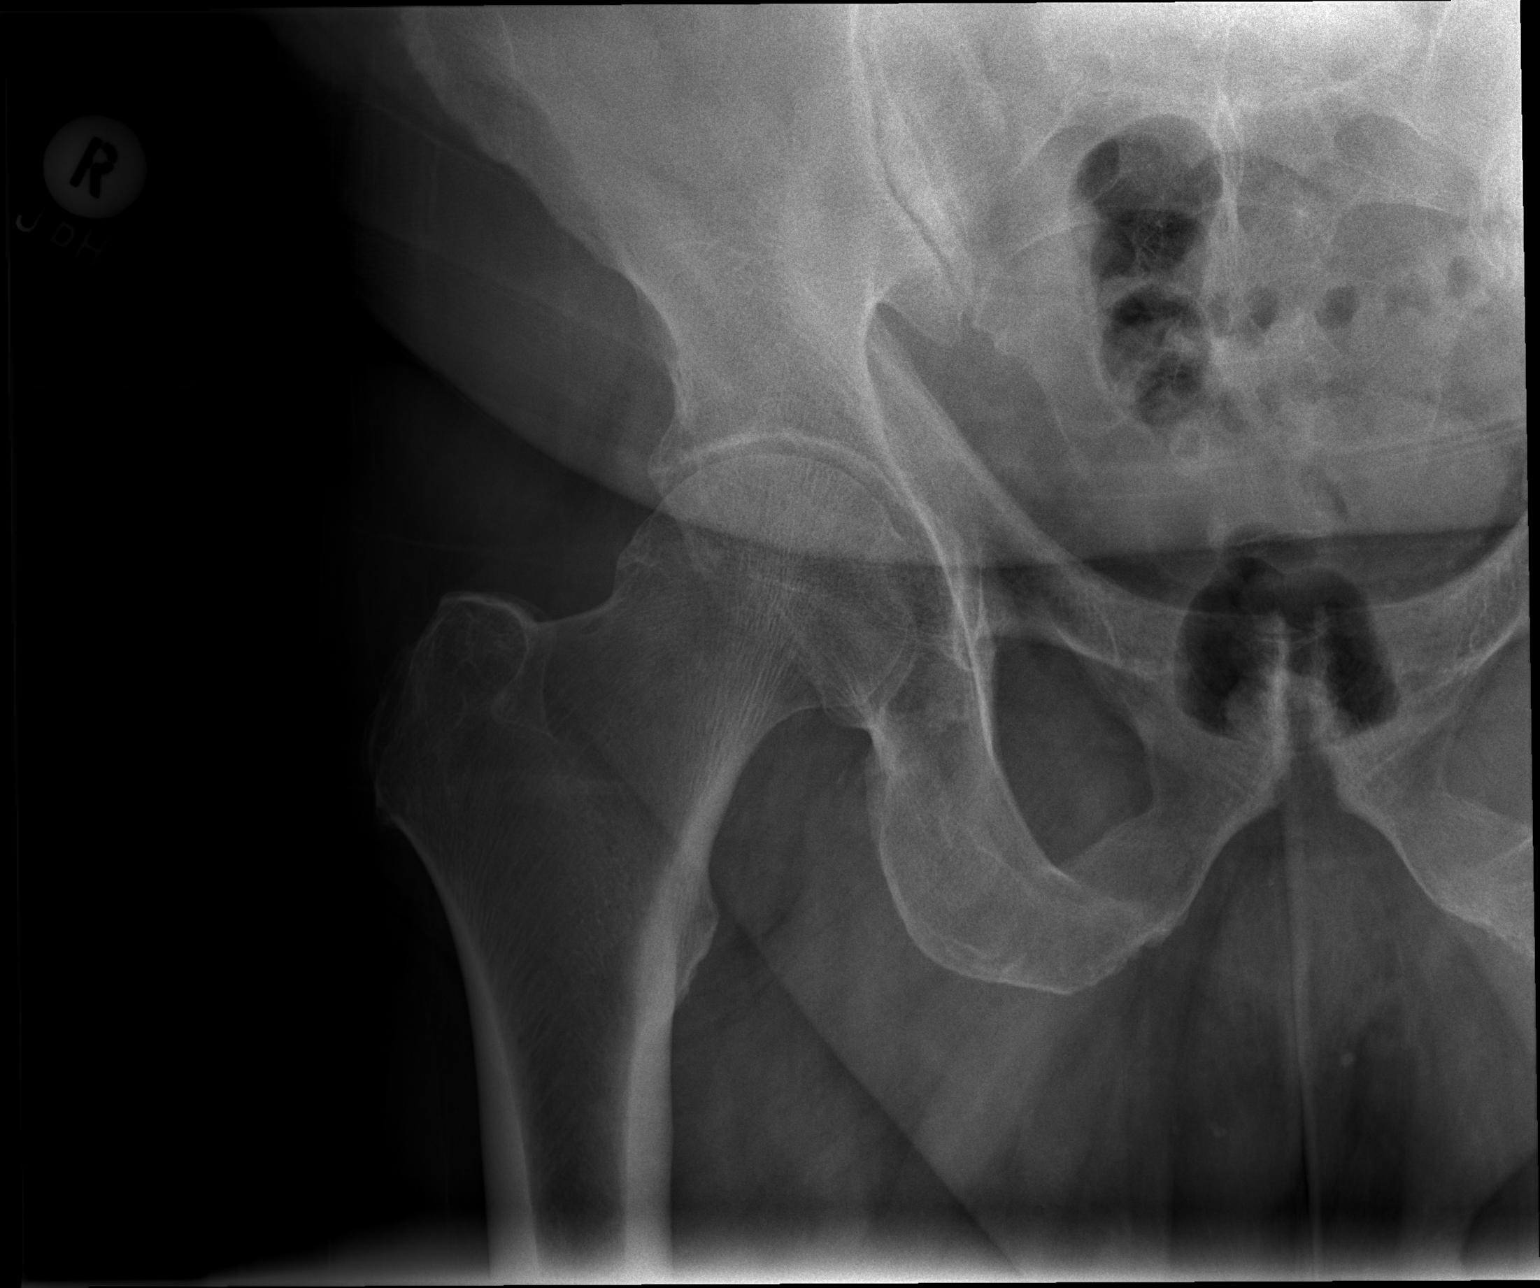

[w hip frog right]
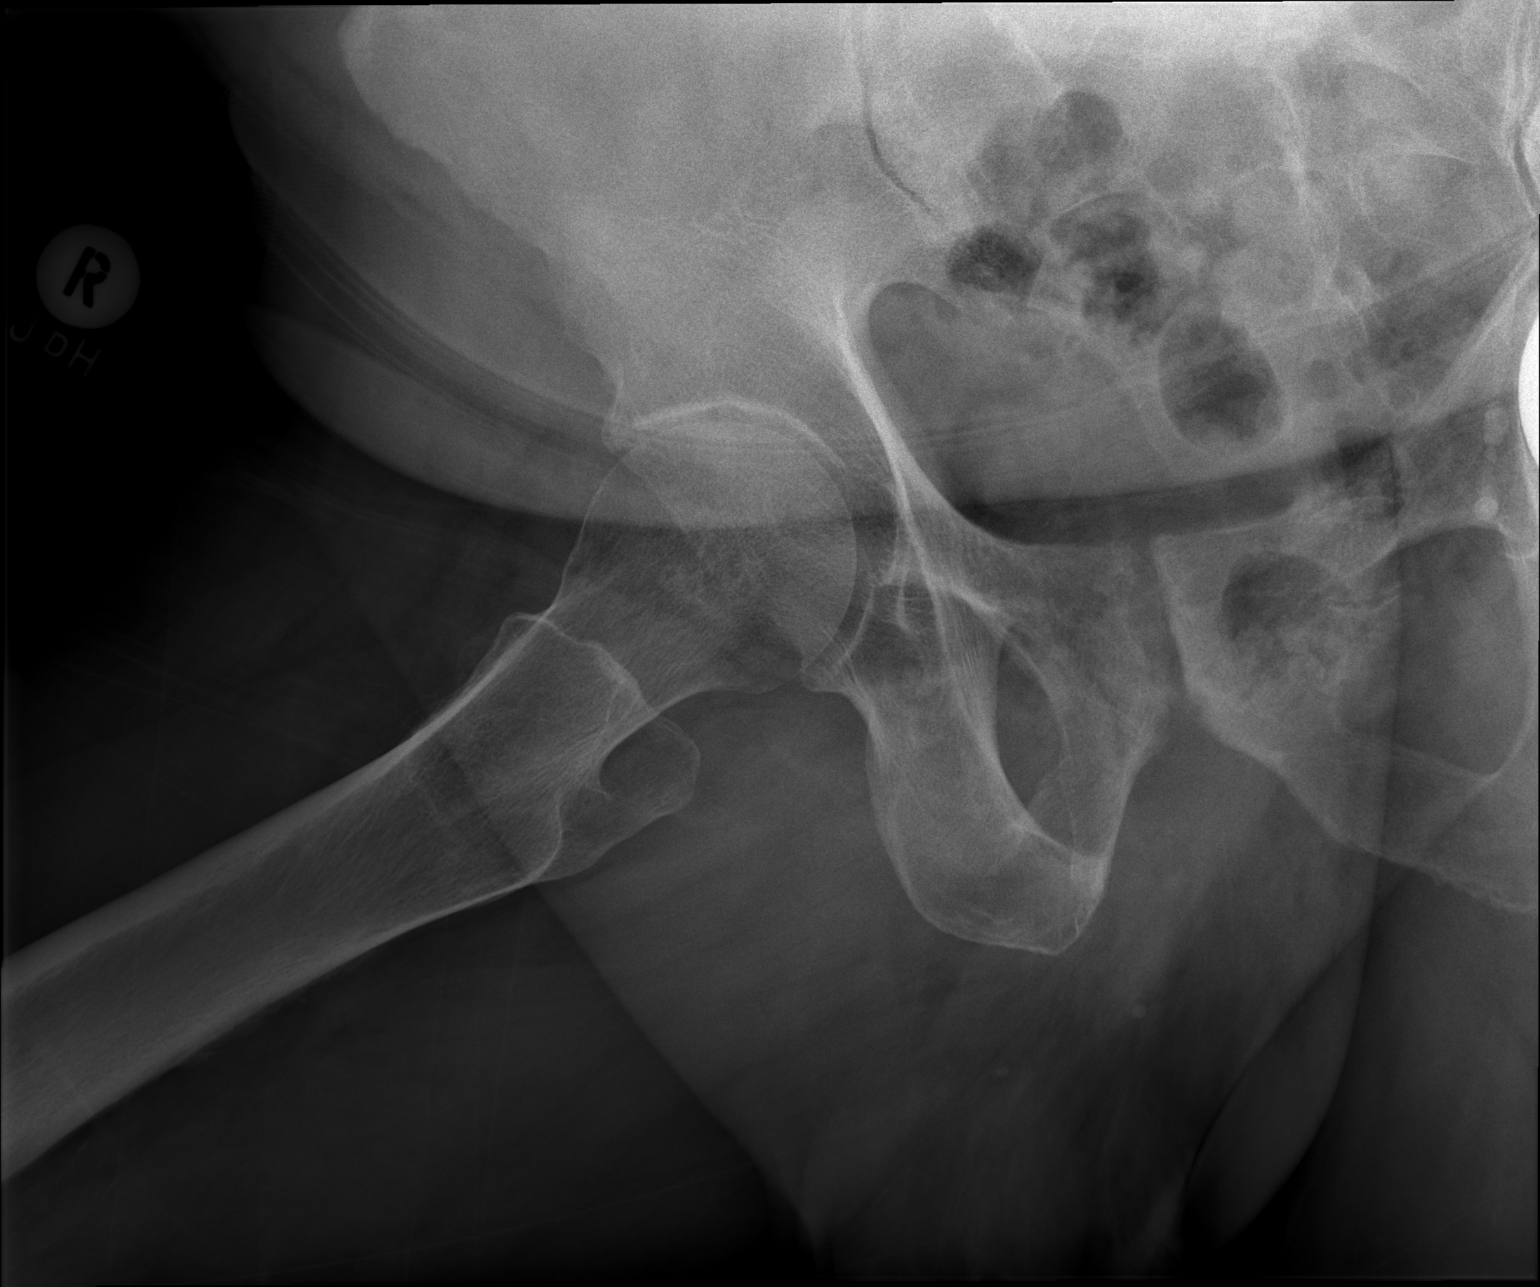

[2 of 2 positions shown; findings below may reference images not displayed]

FINDINGS: Mild degenerative changes with joint space narrowing and spurring.
No acute bony abnormality. Specifically, no fracture, subluxation,
or dislocation. Soft tissues are intact.
IMPRESSION: No acute bony abnormality.  Mild degenerative changes.

## 2019-03-18 ENCOUNTER — Ambulatory Visit: Payer: Medicare Other | Attending: Internal Medicine

## 2019-03-18 DIAGNOSIS — Z23 Encounter for immunization: Secondary | ICD-10-CM | POA: Insufficient documentation

## 2019-03-18 NOTE — Progress Notes (Signed)
   Covid-19 Vaccination Clinic  Name:  Steve Murphy    MRN: [id number] DOB: 09/04/1950  03/18/2019  [name] was observed post Covid-19 immunization for 15 minutes without incident. He was provided with Vaccine Information Sheet and instruction to access the V-Safe system.   [name] was instructed to call 911 with any severe reactions post vaccine: Marland Kitchen Difficulty breathing  . Swelling of face and throat  . A fast heartbeat  . A bad rash all over body  . Dizziness and weakness   Immunizations Administered    Name Date Dose VIS Date Route   Pfizer COVID-19 Vaccine 03/18/2019 12:13 PM 0.3 mL 12/25/2018 Intramuscular   Manufacturer: [employer], [employer]   Lot: [id number]   NDC: [address]-9

## 2019-04-13 ENCOUNTER — Ambulatory Visit: Payer: Medicare Other | Attending: Internal Medicine

## 2019-04-13 DIAGNOSIS — Z23 Encounter for immunization: Secondary | ICD-10-CM

## 2019-04-13 NOTE — Progress Notes (Signed)
   Covid-19 Vaccination Clinic  Name:  JEVEN TOPPER    MRN: [id number] DOB: 1950-02-12  04/13/2019  Mr. Licausi was observed post Covid-19 immunization for 15 minutes without incident. He was provided with Vaccine Information Sheet and instruction to access the V-Safe system.   Mr. Morejon was instructed to call 911 with any severe reactions post vaccine: Marland Kitchen Difficulty breathing  . Swelling of face and throat  . A fast heartbeat  . A bad rash all over body  . Dizziness and weakness   Immunizations Administered    Name Date Dose VIS Date Route   Pfizer COVID-19 Vaccine 04/13/2019  3:22 PM 0.3 mL 12/25/2018 Intramuscular   Manufacturer: [employer], [employer]   Lot: [id number]   NDC: [address]-7

## 2019-04-23 ENCOUNTER — Encounter (HOSPITAL_COMMUNITY): Admission: RE | Admit: 2019-04-23 | Payer: Medicare Other | Source: Ambulatory Visit

## 2019-04-23 ENCOUNTER — Encounter (HOSPITAL_COMMUNITY): Payer: Medicare Other

## 2019-04-29 ENCOUNTER — Encounter (HOSPITAL_COMMUNITY): Admission: RE | Payer: Self-pay | Source: Ambulatory Visit

## 2019-04-29 ENCOUNTER — Ambulatory Visit (HOSPITAL_COMMUNITY): Admission: RE | Admit: 2019-04-29 | Payer: Medicare Other | Source: Ambulatory Visit | Admitting: Orthopedic Surgery

## 2019-04-29 SURGERY — ARTHROPLASTY, SHOULDER, TOTAL, REVERSE
Anesthesia: General | Site: Shoulder | Laterality: Left

## 2022-10-25 ENCOUNTER — Telehealth: Payer: Self-pay | Admitting: *Deleted

## 2022-10-25 NOTE — Telephone Encounter (Signed)
   Pre-operative Risk Assessment    Patient Name: Steve Murphy  DOB: 18-Jan-1950 MRN: [id number]    DATE OF THE LAST VISIT: NONE-PT WILL NEED NEW PT APPT FOR PRE OP CLEARANCE DATE OF NEXT VISIT: NONE   Request for Surgical Clearance    Procedure:   RIGHT TOTAL KNEE ARTHROPLASTY  Date of Surgery:  Clearance 11/06/22                                 Surgeon:  NOT LISTED  Surgeon's Group or Practice Name:  [facility] Phone number:  [phone] ATTN: Aida Raider Fax number:  [phone]   Type of Clearance Requested:   - Medical  - Pharmacy:  Hold Aspirin     Type of Anesthesia:   CHOICE   Additional requests/questions:    Elpidio Anis   10/25/2022, 5:53 PM

## 2022-10-28 NOTE — Telephone Encounter (Signed)
Patient has not previously been seen by our providers. Please verify that requesting provider is seeking a new patient appointment for this patient.  Levi Aland, NP-C  10/28/2022, 7:41 AM [address] Office [phone] Fax [phone]

## 2022-10-28 NOTE — Telephone Encounter (Signed)
Will send a message to our scheduling team to ask if they help find a new pt appt for pre op clearance.

## 2022-10-29 NOTE — Telephone Encounter (Signed)
Pt has new pt appt with Dr, Jacinto Halim 10/31/22. I will update all parties involved.

## 2022-10-31 ENCOUNTER — Ambulatory Visit (HOSPITAL_BASED_OUTPATIENT_CLINIC_OR_DEPARTMENT_OTHER): Payer: Medicare Other

## 2022-10-31 ENCOUNTER — Ambulatory Visit: Payer: Medicare Other | Attending: Cardiology | Admitting: Cardiology

## 2022-10-31 ENCOUNTER — Encounter: Payer: Self-pay | Admitting: Cardiology

## 2022-10-31 VITALS — BP 124/70 | HR 68 | Resp 16 | Ht 71.0 in | Wt 219.6 lb

## 2022-10-31 DIAGNOSIS — Z01810 Encounter for preprocedural cardiovascular examination: Secondary | ICD-10-CM

## 2022-10-31 DIAGNOSIS — Z01818 Encounter for other preprocedural examination: Secondary | ICD-10-CM | POA: Insufficient documentation

## 2022-10-31 DIAGNOSIS — I4819 Other persistent atrial fibrillation: Secondary | ICD-10-CM

## 2022-10-31 DIAGNOSIS — R0609 Other forms of dyspnea: Secondary | ICD-10-CM

## 2022-10-31 DIAGNOSIS — Z136 Encounter for screening for cardiovascular disorders: Secondary | ICD-10-CM | POA: Diagnosis not present

## 2022-10-31 LAB — ECHOCARDIOGRAM COMPLETE
Area-P 1/2: 6.4 cm2
Height: 71 in
S' Lateral: 3 cm
Weight: 3513.6 [oz_av]

## 2022-10-31 NOTE — Progress Notes (Signed)
Cardiology Office Note:  .   Date:  10/31/2022  ID:  Steve Murphy, DOB May 11, 1950, MRN [id number] PCP: Lance Bosch, NP  [facility] Providers Cardiologist:  Yates Decamp, MD    History of Present Illness: .   Steve Murphy is a 72 y.o. male patient with no known significant prior cardiovascular history, no history of hypertension, hypercholesterolemia or diabetes or smoking referred for preoperative cardiovascular risk stratification.  Discussed the use of AI scribe software for clinical note transcription with the patient, who gave verbal consent to proceed.  History of Present Illness   [name], a patient with a history of shortness of breath, fatigue, and irregular pulse for the past four to five years, is scheduled for a right knee arthroplasty. The patient's symptoms have been consistent over the years, with no significant changes. The patient also reports frequent urinary tract infections and is on a maintenance dose of amoxicillin. He also takes Valtrex and tamsulosin for BPH. The patient experiences joint pain and takes six aspirins a day for relief. 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 The patient is active, working full time on an [employer] farm and performing heavy activities such as lifting heavy objects, gets about 15 to 18 K steps a day.       Review of Systems  Cardiovascular:  Negative for chest pain, dyspnea on exertion and leg swelling.   Risk Assessment/Calculations:    CHA2DS2-VASc Score = 1   This indicates a 0.6% annual risk of stroke. The patient's score is based upon: CHF History: 0 HTN History: 0 Diabetes History: 0 Stroke History: 0 Vascular Disease History: 0 Age Score: 1 Gender Score: 0   No results found for: "CHOL", "HDL", "LDLCALC", "LDLDIRECT", "TRIG", "CHOLHDL" Lab Results  Component Value Date   NA 139 02/06/2014   K 3.8 02/06/2014   CO2 28 02/06/2014   GLUCOSE 111 (H)  02/06/2014   BUN 12 02/06/2014   CREATININE 1.10 02/06/2014   CALCIUM 8.2 (L) 02/06/2014   GFRNONAA 69 (L) 02/06/2014   Lab Results  Component Value Date   WBC 12.4 (H) 02/06/2014   HGB 12.3 (L) 02/06/2014   HCT 36.6 (L) 02/06/2014   MCV 91.5 02/06/2014   PLT 231 02/06/2014   External labs:  Labs 10/25/2022:  TSH normal, T3 and T4 normal.  Physical Exam:   VS:  BP 124/70 (BP Location: Left Arm, Patient Position: Sitting, Cuff Size: Large)   Pulse 68   Resp 16   Ht 5\' 11"  (1.803 m)   Wt 219 lb 9.6 oz (99.6 kg)   SpO2 97%   BMI 30.63 kg/m    Wt Readings from Last 3 Encounters:  10/31/22 219 lb 9.6 oz (99.6 kg)  02/04/14 210 lb (95.3 kg)  01/24/14 210 lb 9.6 oz (95.5 kg)     Physical Exam Neck:     Vascular: No carotid bruit or JVD.  Cardiovascular:     Rate and Rhythm: Normal rate. Rhythm irregular.     Pulses: Normal pulses and intact distal pulses.     Heart sounds: No murmur heard. Pulmonary:     Effort: Pulmonary effort is normal.     Breath sounds: Normal breath sounds.  Abdominal:     General: Bowel sounds are normal.     Palpations: Abdomen is soft.  Musculoskeletal:     Right lower leg: No edema.     Left lower leg:  No edema.  Skin:    Capillary Refill: Capillary refill takes less than 2 seconds.     Studies Reviewed: Marland Kitchen    EKG:    EKG Interpretation Date/Time:  Thursday October 31 2022 11:09:34 EDT Ventricular Rate:  111 PR Interval:    QRS Duration:  74 QT Interval:  282 QTC Calculation: 383 R Axis:   2  Text Interpretation: EKG 10/31/2022: Atrial fibrillation with rapid ventricular response at the rate of 111 bpm, normal axis, no evidence of ischemia.  Compared to 01/24/2014, atrial fibrillation is new. Confirmed by Delrae Rend [phone]) on 10/31/2022 11:21:07 AM    Echocardiogram 10/31/2022:  1. Left ventricular ejection fraction, by estimation, is 50 to 55%. The left ventricle has low normal function. The left ventricle has no  regional wall motion abnormalities. There is mild concentric left ventricular hypertrophy. Left ventricular diastolic parameters are indeterminate. 2. Right ventricular systolic function is normal. The right ventricular size is normal. 3. Left atrial size was mildly dilated. 4. Right atrial size was mildly dilated. 5. The mitral valve is normal in structure. Trivial mitral valve regurgitation. No evidence of mitral stenosis. 6. The aortic valve is tricuspid. There is mild calcification of the aortic valve. Aortic valve regurgitation is not visualized. No aortic stenosis is present. 7. Aortic dilatation noted. There is mild dilatation of the ascending aorta, measuring 39 mm. 8. The patient was in atrial fibrillation. 9. The inferior vena cava is normal in size with greater than 50% respiratory variability, suggesting right atrial pressure of 3 mmHg.  ASSESSMENT AND PLAN: .      ICD-10-CM   1. Pre-op evaluation 11/06/22: Right knee  Z01.818 EKG 12-Lead    ECHOCARDIOGRAM COMPLETE    CBC    2. Persistent atrial fibrillation (HCC)  I48.19 ECHOCARDIOGRAM COMPLETE    3. Dyspnea on exertion  R06.09 ECHOCARDIOGRAM COMPLETE    4. Encounter for screening for cardiovascular disorders  Z13.6 Lipid panel     CHA2DS2-VASc Score = 1 [CHF History: 0, HTN History: 0, Diabetes History: 0, Stroke History: 0, Vascular Disease History: 0, Age Score: 1, Gender Score: 0].  Therefore, the patient's annual risk of stroke is 0.6 %.      Assessment and Plan    Atrial Fibrillation Chronic dyspnea and fatigue likely related to longstanding, persistent atrial fibrillation. No hypertension, hypercholesterolemia, or diabetes mellitus. EKG reveals rate controlled atrial fibrillation without ischemia. CHADS-VASc score of 1.0, indicating a low annual risk for stroke (0.6%). -Order echocardiogram to assess left ventricular ejection fraction (LVEF). -Plan to address anticoagulation and possible cardioversion at follow-up  visit in two months.  Preoperative Evaluation for Right Knee Arthroplasty Scheduled for surgery on 11/06/2022 with Dr. Trudee Grip. No significant coronary artery disease suspected given absence of chest pain and ability to perform heavy activities. -If LVEF is normal on echocardiogram, clear for surgery. -Follow-up postoperatively to reassess atrial fibrillation management.   Addendum: Patient's echocardiogram reviewed, normal LVEF, I will go ahead and send clearance for surgery.  Signed,  Yates Decamp, MD, [facility] 10/31/2022, 10:00 PM [facility] [address] [place], [place] [address] Phone: [phone]. Fax:  [phone]

## 2022-10-31 NOTE — Patient Instructions (Addendum)
Medication Instructions:  Your physician recommends that you continue on your current medications as directed. Please refer to the Current Medication list given to you today.  *If you need a refill on your cardiac medications before your next appointment, please call your pharmacy*  Lab Work: TODAY lipid profile and a CBC  Testing/Procedures: Your physician has requested that you have an echocardiogram (MUST be done prior to 11/06/22 for pre-op). Echocardiography is a painless test that uses sound waves to create images of your heart. It provides your doctor with information about the size and shape of your heart and how well your heart's chambers and valves are working. This procedure takes approximately one hour. There are no restrictions for this procedure. Please do NOT wear cologne, perfume, aftershave, or lotions (deodorant is allowed). Please arrive 15 minutes prior to your appointment time.   Follow-Up: At [facility], you and your health needs are our priority.  As part of our continuing mission to provide you with exceptional heart care, we have created designated Provider Care Teams.  These Care Teams include your primary Cardiologist (physician) and Advanced Practice Providers (APPs -  Physician Assistants and Nurse Practitioners) who all work together to provide you with the care you need, when you need it.  We recommend signing up for the patient portal called "MyChart".  Sign up information is provided on this After Visit Summary.  MyChart is used to connect with patients for Virtual Visits (Telemedicine).  Patients are able to view lab/test results, encounter notes, upcoming appointments, etc.  Non-urgent messages can be sent to your provider as well.   To learn more about what you can do with MyChart, go to [email or web address].    Your next appointment:   2 month(s)  The format for your next appointment:   In Person  Provider:   Yates Decamp, MD {  Other  Instructions

## 2022-11-01 LAB — CBC
Hematocrit: 51.2 % — ABNORMAL HIGH (ref 37.5–51.0)
Hemoglobin: 17 g/dL (ref 13.0–17.7)
MCH: 32 pg (ref 26.6–33.0)
MCHC: 33.2 g/dL (ref 31.5–35.7)
MCV: 96 fL (ref 79–97)
Platelets: 237 10*3/uL (ref 150–450)
RBC: 5.31 x10E6/uL (ref 4.14–5.80)
RDW: 12 % (ref 11.6–15.4)
WBC: 7.2 10*3/uL (ref 3.4–10.8)

## 2022-11-01 LAB — LIPID PANEL
Chol/HDL Ratio: 3.3 {ratio} (ref 0.0–5.0)
Cholesterol, Total: 146 mg/dL (ref 100–199)
HDL: 44 mg/dL (ref >39)
LDL Chol Calc (NIH): 86 mg/dL (ref 0–99)
Triglycerides: 82 mg/dL (ref 0–149)
VLDL Cholesterol Cal: 16 mg/dL (ref 5–40)

## 2022-11-01 NOTE — Progress Notes (Signed)
Normal labs and normal cholesterol. Thanks

## 2022-12-31 ENCOUNTER — Ambulatory Visit (INDEPENDENT_AMBULATORY_CARE_PROVIDER_SITE_OTHER): Payer: Medicare Other | Admitting: Cardiology

## 2022-12-31 ENCOUNTER — Telehealth: Payer: Self-pay | Admitting: *Deleted

## 2022-12-31 ENCOUNTER — Encounter: Payer: Self-pay | Admitting: Cardiology

## 2022-12-31 VITALS — BP 160/98 | HR 101 | Resp 16 | Ht 71.0 in | Wt 220.4 lb

## 2022-12-31 DIAGNOSIS — I10 Essential (primary) hypertension: Secondary | ICD-10-CM | POA: Insufficient documentation

## 2022-12-31 DIAGNOSIS — I4819 Other persistent atrial fibrillation: Secondary | ICD-10-CM | POA: Insufficient documentation

## 2022-12-31 DIAGNOSIS — I63412 Cerebral infarction due to embolism of left middle cerebral artery: Secondary | ICD-10-CM | POA: Diagnosis not present

## 2022-12-31 DIAGNOSIS — I639 Cerebral infarction, unspecified: Secondary | ICD-10-CM | POA: Diagnosis not present

## 2022-12-31 MED ORDER — RIVAROXABAN 20 MG PO TABS
20.0000 mg | ORAL_TABLET | Freq: Every day | ORAL | 1 refills | Status: DC
Start: 2022-12-31 — End: 2023-01-03

## 2022-12-31 MED ORDER — VERAPAMIL HCL ER 180 MG PO TBCR
180.0000 mg | EXTENDED_RELEASE_TABLET | Freq: Every day | ORAL | 2 refills | Status: DC
Start: 2022-12-31 — End: 2023-02-20

## 2022-12-31 NOTE — Progress Notes (Signed)
Cardiology Office Note:  .   Date:  12/31/2022  ID:  Steve Murphy, DOB [birth year], MRN [id number] PCP: Lance Bosch, NP  [facility] Providers Cardiologist:  Yates Decamp, MD   History of Present Illness: .   Steve Murphy is a 72 y.o. Caucasian male patient evaluated by me on 11/06/2022 for preoperative cardiovascular risk stratification for upcoming right knee arthroplasty, was found to be in atrial fibrillation which I felt was persistent atrial fibrillation, underwent echocardiogram on 11/06/2022 revealing normal LVEF hence had cleared him for surgery.  He now presents for a 62-month office visit.Did well without peri-procedural complications. Continues to endorse dyspnea and fatigue.   Discussed the use of AI scribe software for clinical note transcription with the patient, who gave verbal consent to proceed.  History of Present Illness   The patient, with a history of atrial fibrillation (AFib) and recent left knee surgery, presents for follow-up. He was found to be in AFib during his pre-operative clearance visit two months ago. He reports a gradual worsening of shortness of breath and decreased endurance over the past year. Despite these symptoms, his stroke risk from AFib was deemed low (CHADS VAsc  1), and he was not started on blood thinners prior to surgery. His blood pressure has been noted to be high, with readings as high as 188/90 on the day of surgery and 150/90 at home. He acknowledges that he likely has hypertension.      Review of Systems  Constitutional: Positive for malaise/fatigue.  Cardiovascular:  Positive for dyspnea on exertion. Negative for chest pain and leg swelling.   Labs   Lab Results  Component Value Date   CHOL 146 10/31/2022   HDL 44 10/31/2022   LDLCALC 86 10/31/2022   TRIG 82 10/31/2022   CHOLHDL 3.3 10/31/2022   Lab Results  Component Value Date   NA 139 02/06/2014   K 3.8 02/06/2014   CO2 28 02/06/2014   GLUCOSE 111 (H)  02/06/2014   BUN 12 02/06/2014   CREATININE 1.10 02/06/2014   CALCIUM 8.2 (L) 02/06/2014   GFRNONAA 69 (L) 02/06/2014      Latest Ref Rng & Units 02/06/2014    5:14 AM 02/05/2014    3:42 AM 01/24/2014   11:13 AM  BMP  Glucose 70 - 99 mg/dL 191  478  97   BUN 6 - 23 mg/dL 12  12  21    Creatinine 0.50 - 1.35 mg/dL 2.95  6.21  3.08   Sodium 135 - 145 mmol/L 139  137  137   Potassium 3.5 - 5.1 mmol/L 3.8  3.8  4.3   Chloride 96 - 112 mmol/L 104  104  105   CO2 19 - 32 mmol/L 28  26  27    Calcium 8.4 - 10.5 mg/dL 8.2  8.2  9.4       Latest Ref Rng & Units 10/31/2022   12:24 PM 02/06/2014    5:14 AM 02/05/2014    3:42 AM  CBC  WBC 3.4 - 10.8 x10E3/uL 7.2  12.4  14.7   Hemoglobin 13.0 - 17.7 g/dL 65.7  84.6  96.2   Hematocrit 37.5 - 51.0 % 51.2  36.6  37.2   Platelets 150 - 450 x10E3/uL 237  231  287    External labs:  Labs 10/25/2022:  TSH normal, T3 and T4 normal.  Physical Exam:   VS:  BP (!) 160/98 (BP Location: Left Arm, Patient Position: Sitting, Cuff Size: Normal)  Pulse (!) 101   Resp 16   Ht 5\' 11"  (1.803 m)   Wt 220 lb 6.4 oz (100 kg)   SpO2 97%   BMI 30.74 kg/m    Wt Readings from Last 3 Encounters:  12/31/22 220 lb 6.4 oz (100 kg)  10/31/22 219 lb 9.6 oz (99.6 kg)  02/04/14 210 lb (95.3 kg)    Physical Exam Neck:     Vascular: No carotid bruit or JVD.  Cardiovascular:     Rate and Rhythm: Tachycardia present. Rhythm irregular.     Pulses: Normal pulses and intact distal pulses.     Heart sounds: No murmur heard. Pulmonary:     Effort: Pulmonary effort is normal.     Breath sounds: Normal breath sounds.  Abdominal:     General: Bowel sounds are normal.     Palpations: Abdomen is soft.  Musculoskeletal:     Right lower leg: No edema.     Left lower leg: No edema.  Skin:    Capillary Refill: Capillary refill takes less than 2 seconds.    Studies Reviewed: .    Echocardiogram 10/31/2022:  1. Left ventricular ejection fraction, by estimation, is  50 to 55%. The left ventricle has low normal function. The left ventricle has no regional wall motion abnormalities. There is mild concentric left ventricular hypertrophy. Left ventricular diastolic parameters are indeterminate. 2. Right ventricular systolic function is normal. The right ventricular size is normal. 3. Left atrial size was mildly dilated. 4. Right atrial size was mildly dilated. 5. The mitral valve is normal in structure. Trivial mitral valve regurgitation. No evidence of mitral stenosis. 6. The aortic valve is tricuspid. There is mild calcification of the aortic valve. Aortic valve regurgitation is not visualized. No aortic stenosis is present. 7. Aortic dilatation noted. There is mild dilatation of the ascending aorta, measuring 39 mm. 8. The patient was in atrial fibrillation. 9. The inferior vena cava is normal in size with greater than 50% respiratory variability, suggesting right atrial pressure of 3 mmHg.  EKG:    EKG Interpretation Date/Time:  Tuesday December 31 2022 10:25:11 EST Ventricular Rate:  101 PR Interval:    QRS Duration:  78 QT Interval:  332 QTC Calculation: 430 R Axis:   19  Text Interpretation: EKG 12/31/2022: Atrial fibrillation with rapid ventricular sponsor at rate of 101 bpm, normal axis, single PVC.  Compared to 10/31/2022, no significant change. Confirmed by Delrae Rend [phone]) on 12/31/2022 10:37:30 AM    Medications and allergies    No Known Allergies   Current Outpatient Medications:    acetaminophen (TYLENOL) 500 MG tablet, Take 1,000 mg by mouth every 6 (six) hours as needed (pain.)., Disp: , Rfl:    amoxicillin (AMOXIL) 500 MG tablet, Take 500 mg by mouth 2 (two) times daily., Disp: , Rfl:    rivaroxaban (XARELTO) 20 MG TABS tablet, Take 1 tablet (20 mg total) by mouth daily with supper., Disp: 90 tablet, Rfl: 1   tamsulosin (FLOMAX) 0.4 MG CAPS capsule, Take 0.4 mg by mouth daily., Disp: , Rfl:    valACYclovir (VALTREX) 1000  MG tablet, Take 500 mg by mouth daily as needed (fever blisters/cold sores). , Disp: , Rfl:    verapamil (CALAN-SR) 180 MG CR tablet, Take 1 tablet (180 mg total) by mouth at bedtime., Disp: 30 tablet, Rfl: 2   ASSESSMENT AND PLAN: .      ICD-10-CM   1. Persistent atrial fibrillation (HCC)  I48.19 EKG 12-Lead  rivaroxaban (XARELTO) 20 MG TABS tablet    verapamil (CALAN-SR) 180 MG CR tablet    Basic Metabolic Panel (BMET)    CBC    Magnesium    Informed Consent Details: Physician/Practitioner Attestation; Transcribe to consent form and obtain patient signature    2. Primary hypertension  I10 verapamil (CALAN-SR) 180 MG CR tablet    Basic Metabolic Panel (BMET)    CBC    Magnesium     Click Here to Calculate/Change CHADS2VASc Score The patient's CHADS2-VASc score is 2, indicating a 2.2% annual risk of stroke.  Therefore, anticoagulation is recommended.   CHF History: No HTN History: Yes Diabetes History: No Stroke History: No Vascular Disease History: No   Assessment and Plan    Atrial Fibrillation Asymptomatic, discovered during preoperative clearance for knee surgery. Low stroke risk, hence no anticoagulation initiated prior to surgery. However, patient reports progressive shortness of breath over the past year. Plan to initiate anticoagulation and attempt cardioversion to restore sinus rhythm. -Start anticoagulation therapy and continue for at least 3 weeks prior to cardioversion. -Schedule cardioversion in 3 weeks. -Discontinue aspirin use due to initiation of anticoagulation. -Plan for follow-up with nurse practitioner or physician assistant 1 month post-cardioversion.  Hypertension Elevated blood pressure readings noted during current visit and reported by patient at home and during day of surgery. -Start Verapamil. -Advise patient to monitor blood pressure at home and report readings via MyChart. -Order CBC, BMP, and magnesium level.  Follow-up Plan for patient to  return in 3 months post-cardioversion. If stable, follow-up every 6 months for a year, then annually. If he maintains sinus will do routine GXT post cardioversion, If recurrent A.Fib, then consider nuclear stress and if negative or low risk start Flecainide and re-attempt cardioversion.        Informed Consent   Shared Decision Making/Informed Consent The risks (stroke, cardiac arrhythmias rarely resulting in the need for a temporary or permanent pacemaker, skin irritation or burns and complications associated with conscious sedation including aspiration, arrhythmia, respiratory failure and death), benefits (restoration of normal sinus rhythm) and alternatives of a direct current cardioversion were explained in detail to Mr. Caffrey and he agrees to proceed.   I spent a total of 45 minutes discussing cardioversion,   Signed,  Yates Decamp, MD, [facility] 12/31/2022, 9:30 PM [facility] [address] [place], [place] [address] Phone: [phone]. Fax:  [phone]

## 2022-12-31 NOTE — Telephone Encounter (Signed)
In addition to appointment with Jari Favre, PA on 02/24/23 patient needs appointment with Dr Jacinto Halim in late March /early April.  I placed call to patient to schedule this appointment.  Left message to call office

## 2022-12-31 NOTE — Patient Instructions (Signed)
Medication Instructions:  Your physician has recommended you make the following change in your medication:  Start Xarelto 20 mg by mouth daily with evening meal Start Verapamil SR 180 mg by mouth daily  Stop aspirin  *If you need a refill on your cardiac medications before your next appointment, please call your pharmacy*   Lab Work: Have lab work done at [facility] on the first floor today--BMP, CBC, Magnesium If you have labs (blood work) drawn today and your tests are completely normal, you will receive your results only by: [employer] (if you have MyChart) OR A paper copy in the mail If you have any lab test that is abnormal or we need to change your treatment, we will call you to review the results.   Testing/Procedures: Your physician has recommended that you have a Cardioversion (DCCV). Electrical Cardioversion uses a jolt of electricity to your heart either through paddles or wired patches attached to your chest. This is a controlled, usually prescheduled, procedure. Defibrillation is done under light anesthesia in the hospital, and you usually go home the day of the procedure. This is done to get your heart back into a normal rhythm. You are not awake for the procedure. Please see the instruction sheet given to you today. Scheduled for January 9,2025    Follow-Up: At [facility], you and your health needs are our priority.  As part of our continuing mission to provide you with exceptional heart care, we have created designated Provider Care Teams.  These Care Teams include your primary Cardiologist (physician) and Advanced Practice Providers (APPs -  Physician Assistants and Nurse Practitioners) who all work together to provide you with the care you need, when you need it.  We recommend signing up for the patient portal called "MyChart".  Sign up information is provided on this After Visit Summary.  MyChart is used to connect with patients for Virtual Visits  (Telemedicine).  Patients are able to view lab/test results, encounter notes, upcoming appointments, etc.  Non-urgent messages can be sent to your provider as well.   To learn more about what you can do with MyChart, go to [email or web address].    Your next appointment:   1 month(s)  after cardioversion  (around February 9)  Provider:   Jari Favre, PA-C, Ronie Spies, PA-C, Robin Searing, NP, Jacolyn Reedy, PA-C, Eligha Bridegroom, NP, Tereso Newcomer, PA-C, or Perlie Gold, PA-C     Then, Yates Decamp, MD will plan to see you again in 3 month(s).from today    Other Instructions      Dear Steve Murphy  You are scheduled for a Cardioversion on Thursday, January 9 with Dr. Wyline Mood.  Please arrive at the [facility] (Main Entrance A) at [facility]: [address] [place], [place] [address] at 7:30 AM (This time is 1 hour(s) before your procedure to ensure your preparation).   Free valet parking service is available. You will check in at ADMITTING.   *Please Note: You will receive a call the day before your procedure to confirm the appointment time. That time may have changed from the original time based on the schedule for that day.*    DIET:  Nothing to eat or drink after midnight except a sip of water with medications (see medication instructions below)  MEDICATION INSTRUCTIONS: !!IF ANY NEW MEDICATIONS ARE STARTED AFTER TODAY, PLEASE NOTIFY YOUR PROVIDER AS SOON AS POSSIBLE!!  FYI: Medications such as Semaglutide (Ozempic, [place]), Tirzepatide (Mounjaro, Zepbound), Dulaglutide (Trulicity), etc ("GLP1  agonists") AND Canagliflozin (Invokana), Dapagliflozin (Farxiga), Empagliflozin (Jardiance), Ertugliflozin (Steglatro), Bexagliflozin [employer]) or any combination with one of these drugs such as Invokamet (Canagliflozin/Metformin), Synjardy (Empagliflozin/Metformin), etc ("SGLT2 inhibitors") must be held around the time of a procedure. This is not a comprehensive list of all of these  drugs. Please review all of your medications and talk to your provider if you take any one of these. If you are not sure, ask your provider.     Continue taking your anticoagulant (blood thinner): Rivaroxaban (Xarelto).  You will need to continue this after your procedure until you are told by your provider that it is safe to stop.    LABS: done on 12/17  FYI:  For your safety, and to allow [place] to monitor your vital signs accurately during the surgery/procedure we request: If you have artificial nails, gel coating, SNS etc, please have those removed prior to your surgery/procedure. Not having the nail coverings /polish removed may result in cancellation or delay of your surgery/procedure.  Your support person will be asked to wait in the waiting room during your procedure.  It is OK to have someone drop you off and come back when you are ready to be discharged.  You cannot drive after the procedure and will need someone to drive you home.  Bring your insurance cards.  *Special Note: Every effort is made to have your procedure done on time. Occasionally there are emergencies that occur at the hospital that may cause delays. Please be patient if a delay does occur.

## 2023-01-01 LAB — BASIC METABOLIC PANEL
BUN/Creatinine Ratio: 16 (ref 10–24)
BUN: 20 mg/dL (ref 8–27)
CO2: 24 mmol/L (ref 20–29)
Calcium: 9.5 mg/dL (ref 8.6–10.2)
Chloride: 106 mmol/L (ref 96–106)
Creatinine, Ser: 1.22 mg/dL (ref 0.76–1.27)
Glucose: 92 mg/dL (ref 70–99)
Potassium: 4.6 mmol/L (ref 3.5–5.2)
Sodium: 144 mmol/L (ref 134–144)
eGFR: 63 mL/min/{1.73_m2} (ref >59)

## 2023-01-01 LAB — CBC
Hematocrit: 45.5 % (ref 37.5–51.0)
Hemoglobin: 15 g/dL (ref 13.0–17.7)
MCH: 31.1 pg (ref 26.6–33.0)
MCHC: 33 g/dL (ref 31.5–35.7)
MCV: 94 fL (ref 79–97)
Platelets: 246 10*3/uL (ref 150–450)
RBC: 4.83 x10E6/uL (ref 4.14–5.80)
RDW: 11.9 % (ref 11.6–15.4)
WBC: 6.7 10*3/uL (ref 3.4–10.8)

## 2023-01-01 LAB — MAGNESIUM: Magnesium: 2.1 mg/dL (ref 1.6–2.3)

## 2023-01-02 ENCOUNTER — Encounter (HOSPITAL_COMMUNITY): Payer: Self-pay

## 2023-01-02 ENCOUNTER — Emergency Department (HOSPITAL_COMMUNITY): Payer: Medicare Other

## 2023-01-02 ENCOUNTER — Inpatient Hospital Stay (HOSPITAL_COMMUNITY)
Admission: EM | Admit: 2023-01-02 | Discharge: 2023-01-03 | DRG: 063 | Disposition: A | Discharge status: Home / Self Care | Payer: Medicare Other | Attending: Neurology | Admitting: Neurology

## 2023-01-02 ENCOUNTER — Inpatient Hospital Stay (HOSPITAL_COMMUNITY): Payer: Medicare Other

## 2023-01-02 DIAGNOSIS — R4701 Aphasia: Secondary | ICD-10-CM | POA: Diagnosis present

## 2023-01-02 DIAGNOSIS — I10 Essential (primary) hypertension: Secondary | ICD-10-CM | POA: Diagnosis present

## 2023-01-02 DIAGNOSIS — I63412 Cerebral infarction due to embolism of left middle cerebral artery: Principal | ICD-10-CM | POA: Diagnosis present

## 2023-01-02 DIAGNOSIS — R29701 NIHSS score 1: Secondary | ICD-10-CM | POA: Diagnosis present

## 2023-01-02 DIAGNOSIS — Z96651 Presence of right artificial knee joint: Secondary | ICD-10-CM | POA: Diagnosis present

## 2023-01-02 DIAGNOSIS — A64 Unspecified sexually transmitted disease: Secondary | ICD-10-CM | POA: Diagnosis present

## 2023-01-02 DIAGNOSIS — E785 Hyperlipidemia, unspecified: Secondary | ICD-10-CM | POA: Diagnosis present

## 2023-01-02 DIAGNOSIS — Z79899 Other long term (current) drug therapy: Secondary | ICD-10-CM | POA: Diagnosis not present

## 2023-01-02 DIAGNOSIS — Z96642 Presence of left artificial hip joint: Secondary | ICD-10-CM | POA: Diagnosis present

## 2023-01-02 DIAGNOSIS — I639 Cerebral infarction, unspecified: Secondary | ICD-10-CM | POA: Diagnosis present

## 2023-01-02 DIAGNOSIS — Z7901 Long term (current) use of anticoagulants: Secondary | ICD-10-CM

## 2023-01-02 DIAGNOSIS — Z800 Family history of malignant neoplasm of digestive organs: Secondary | ICD-10-CM | POA: Diagnosis not present

## 2023-01-02 DIAGNOSIS — Z8261 Family history of arthritis: Secondary | ICD-10-CM

## 2023-01-02 DIAGNOSIS — I4891 Unspecified atrial fibrillation: Secondary | ICD-10-CM | POA: Diagnosis present

## 2023-01-02 LAB — CBC
HCT: 44.2 % (ref 39.0–52.0)
HCT: 45 % (ref 39.0–52.0)
Hemoglobin: 14.4 g/dL (ref 13.0–17.0)
Hemoglobin: 15.1 g/dL (ref 13.0–17.0)
MCH: 30.8 pg (ref 26.0–34.0)
MCH: 30.9 pg (ref 26.0–34.0)
MCHC: 32.6 g/dL (ref 30.0–36.0)
MCHC: 33.6 g/dL (ref 30.0–36.0)
MCV: 94.6 fL (ref 80.0–100.0)
MCV: 92 fL (ref 80.0–100.0)
Platelets: 220 10*3/uL (ref 150–400)
Platelets: 257 10*3/uL (ref 150–400)
RBC: 4.67 MIL/uL (ref 4.22–5.81)
RBC: 4.89 MIL/uL (ref 4.22–5.81)
RDW: 13.2 % (ref 11.5–15.5)
RDW: 13.2 % (ref 11.5–15.5)
WBC: 6.2 10*3/uL (ref 4.0–10.5)
WBC: 6.7 10*3/uL (ref 4.0–10.5)
nRBC: 0 % (ref 0.0–0.2)
nRBC: 0 % (ref 0.0–0.2)

## 2023-01-02 LAB — URINALYSIS, ROUTINE W REFLEX MICROSCOPIC
Bilirubin Urine: NEGATIVE
Glucose, UA: NEGATIVE mg/dL
Hgb urine dipstick: NEGATIVE
Ketones, ur: NEGATIVE mg/dL
Leukocytes,Ua: NEGATIVE
Nitrite: NEGATIVE
Protein, ur: NEGATIVE mg/dL
Specific Gravity, Urine: 1.025 (ref 1.005–1.030)
pH: 5 (ref 5.0–8.0)

## 2023-01-02 LAB — COMPREHENSIVE METABOLIC PANEL
ALT: 14 U/L (ref 0–44)
ALT: 15 U/L (ref 0–44)
AST: 14 U/L — ABNORMAL LOW (ref 15–41)
AST: 15 U/L (ref 15–41)
Albumin: 3.8 g/dL (ref 3.5–5.0)
Albumin: 3.8 g/dL (ref 3.5–5.0)
Alkaline Phosphatase: 93 U/L (ref 38–126)
Alkaline Phosphatase: 99 U/L (ref 38–126)
Anion gap: 8 (ref 5–15)
Anion gap: 11 (ref 5–15)
BUN: 19 mg/dL (ref 8–23)
BUN: 17 mg/dL (ref 8–23)
CO2: 22 mmol/L (ref 22–32)
CO2: 20 mmol/L — ABNORMAL LOW (ref 22–32)
Calcium: 9.1 mg/dL (ref 8.9–10.3)
Calcium: 9 mg/dL (ref 8.9–10.3)
Chloride: 109 mmol/L (ref 98–111)
Chloride: 107 mmol/L (ref 98–111)
Creatinine, Ser: 1.16 mg/dL (ref 0.61–1.24)
Creatinine, Ser: 1.07 mg/dL (ref 0.61–1.24)
GFR, Estimated: >60 mL/min (ref >60)
GFR, Estimated: >60 mL/min (ref >60)
Glucose, Bld: 107 mg/dL — ABNORMAL HIGH (ref 70–99)
Glucose, Bld: 96 mg/dL (ref 70–99)
Potassium: 3.7 mmol/L (ref 3.5–5.1)
Potassium: 3.8 mmol/L (ref 3.5–5.1)
Sodium: 139 mmol/L (ref 135–145)
Sodium: 138 mmol/L (ref 135–145)
Total Bilirubin: 0.9 mg/dL (ref <1.2)
Total Bilirubin: 1.3 mg/dL — ABNORMAL HIGH (ref <1.2)
Total Protein: 6.5 g/dL (ref 6.5–8.1)
Total Protein: 6.6 g/dL (ref 6.5–8.1)

## 2023-01-02 LAB — DIFFERENTIAL
Abs Immature Granulocytes: 0.02 10*3/uL (ref 0.00–0.07)
Basophils Absolute: 0 10*3/uL (ref 0.0–0.1)
Basophils Relative: 1 %
Eosinophils Absolute: 0.1 10*3/uL (ref 0.0–0.5)
Eosinophils Relative: 2 %
Immature Granulocytes: 0 %
Lymphocytes Relative: 19 %
Lymphs Abs: 1.3 10*3/uL (ref 0.7–4.0)
Monocytes Absolute: 0.5 10*3/uL (ref 0.1–1.0)
Monocytes Relative: 7 %
Neutro Abs: 4.8 10*3/uL (ref 1.7–7.7)
Neutrophils Relative %: 71 %

## 2023-01-02 LAB — APTT: aPTT: 26 s (ref 24–36)

## 2023-01-02 LAB — CBG MONITORING, ED: Glucose-Capillary: 86 mg/dL (ref 70–99)

## 2023-01-02 LAB — ETHANOL: Alcohol, Ethyl (B): <10 mg/dL (ref <10)

## 2023-01-02 LAB — RAPID URINE DRUG SCREEN, HOSP PERFORMED
Amphetamines: NOT DETECTED
Barbiturates: NOT DETECTED
Benzodiazepines: NOT DETECTED
Cocaine: NOT DETECTED
Opiates: NOT DETECTED
Tetrahydrocannabinol: NOT DETECTED

## 2023-01-02 LAB — HEMOGLOBIN A1C
Hgb A1c MFr Bld: 5 % (ref 4.8–5.6)
Mean Plasma Glucose: 96.8 mg/dL

## 2023-01-02 LAB — PROTIME-INR
INR: 1.1 (ref 0.8–1.2)
Prothrombin Time: 14.2 s (ref 11.4–15.2)

## 2023-01-02 LAB — MRSA NEXT GEN BY PCR, NASAL: MRSA by PCR Next Gen: NOT DETECTED

## 2023-01-02 MED ORDER — ACETAMINOPHEN 650 MG RE SUPP: 650.0000 mg | RECTAL | Status: DC | PRN

## 2023-01-02 MED ORDER — PANTOPRAZOLE SODIUM 40 MG IV SOLR
40.0000 mg | Freq: Every day | INTRAVENOUS | Status: DC
  Administered 2023-01-02: 40 mg via INTRAVENOUS
  Filled 2023-01-02: qty 10

## 2023-01-02 MED ORDER — STROKE: EARLY STAGES OF RECOVERY BOOK
Freq: Once | Status: AC
  Administered 2023-01-03: 1
  Filled 2023-01-02: qty 1

## 2023-01-02 MED ORDER — ACETAMINOPHEN 160 MG/5ML PO SOLN: 650.0000 mg | ORAL | Status: DC | PRN

## 2023-01-02 MED ORDER — IOHEXOL 350 MG/ML SOLN
75.0000 mL | Freq: Once | INTRAVENOUS | Status: AC | PRN
  Administered 2023-01-02: 75 mL via INTRAVENOUS

## 2023-01-02 MED ORDER — HYDRALAZINE HCL 20 MG/ML IJ SOLN
10.0000 mg | Freq: Once | INTRAMUSCULAR | Status: AC
  Administered 2023-01-02: 10 mg via INTRAVENOUS

## 2023-01-02 MED ORDER — ORAL CARE MOUTH RINSE: 15.0000 mL | OROMUCOSAL | Status: DC | PRN

## 2023-01-02 MED ORDER — SENNOSIDES-DOCUSATE SODIUM 8.6-50 MG PO TABS: 1.0000 | ORAL_TABLET | Freq: Every evening | ORAL | Status: DC | PRN

## 2023-01-02 MED ORDER — AMOXICILLIN 500 MG PO CAPS
500.0000 mg | ORAL_CAPSULE | Freq: Every day | ORAL | Status: DC
  Administered 2023-01-02 – 2023-01-03 (×2): 500 mg via ORAL
  Filled 2023-01-02 (×2): qty 1

## 2023-01-02 MED ORDER — TAMSULOSIN HCL 0.4 MG PO CAPS
0.4000 mg | ORAL_CAPSULE | Freq: Every day | ORAL | Status: DC
  Administered 2023-01-02 – 2023-01-03 (×2): 0.4 mg via ORAL
  Filled 2023-01-02 (×2): qty 1

## 2023-01-02 MED ORDER — ACETAMINOPHEN-CODEINE 300-30 MG PO TABS
1.0000 | ORAL_TABLET | Freq: Three times a day (TID) | ORAL | Status: DC | PRN
  Administered 2023-01-02 – 2023-01-03 (×2): 2 via ORAL
  Filled 2023-01-02 (×2): qty 2

## 2023-01-02 MED ORDER — SODIUM CHLORIDE 0.9 % IV SOLN: INTRAVENOUS | Status: AC

## 2023-01-02 MED ORDER — TENECTEPLASE FOR STROKE
0.2500 mg/kg | PACK | Freq: Once | INTRAVENOUS | Status: AC
  Administered 2023-01-02: 25 mg via INTRAVENOUS
  Filled 2023-01-02: qty 10

## 2023-01-02 MED ORDER — ACETAMINOPHEN 325 MG PO TABS
650.0000 mg | ORAL_TABLET | ORAL | Status: DC | PRN
  Administered 2023-01-02: 650 mg via ORAL
  Filled 2023-01-02: qty 2

## 2023-01-02 MED ORDER — DILTIAZEM HCL ER 60 MG PO CP12
60.0000 mg | ORAL_CAPSULE | Freq: Two times a day (BID) | ORAL | Status: DC
  Administered 2023-01-02: 60 mg via ORAL
  Filled 2023-01-02 (×2): qty 1

## 2023-01-02 MED ORDER — VERAPAMIL HCL ER 180 MG PO TBCR
180.0000 mg | EXTENDED_RELEASE_TABLET | Freq: Every day | ORAL | Status: DC
  Administered 2023-01-02: 180 mg via ORAL
  Filled 2023-01-02 (×2): qty 1

## 2023-01-02 MED ORDER — CLEVIDIPINE BUTYRATE 0.5 MG/ML IV EMUL: 0.0000 mg/h | INTRAVENOUS | Status: DC

## 2023-01-02 NOTE — Progress Notes (Signed)
PHARMACIST CODE STROKE RESPONSE  Notified to mix TNK at 1253 by Dr. Pearlean Brownie TNK preparation completed at 1254  TNK dose = 25 mg IV over 5 seconds.   Issues/delays encountered (if applicable):   Steve Murphy 01/02/23 1:12 PM

## 2023-01-02 NOTE — ED Provider Notes (Addendum)
[place] EMERGENCY DEPARTMENT AT [facility] Provider Note   CSN: [id number] Arrival date & time: 01/02/23  1126  An emergency department physician performed an initial assessment on this suspected stroke patient at 1236.  History  Chief Complaint  Patient presents with   Altered Mental Status    Steve Murphy is a 72 y.o. male.  Patient is a 72 year old male with past medical history of A-fib, recent right sided total knee repair, and hypertension presenting for speech changes.  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  Symptoms lasted only 3 to 6 minutes before completely resolving.  They deny any facial asymmetry, sensation, or motor dysfunction.  Patient is not on blood thinners.  The history is provided by the patient. No language interpreter was used.  Altered Mental Status Associated symptoms: no abdominal pain, no fever, no palpitations, no rash, no seizures and no vomiting        Home Medications Prior to Admission medications   Medication Sig Start Date End Date Taking? Authorizing Provider  acetaminophen (TYLENOL) 500 MG tablet Take 1,000 mg by mouth every 6 (six) hours as needed (pain.).    [provider]  amoxicillin (AMOXIL) 500 MG tablet Take 500 mg by mouth 2 (two) times daily.    [provider]  rivaroxaban (XARELTO) 20 MG TABS tablet Take 1 tablet (20 mg total) by mouth daily with supper. Patient not taking: Reported on 01/02/2023 12/31/22   Yates Decamp, MD  tamsulosin (FLOMAX) 0.4 MG CAPS capsule Take 0.4 mg by mouth daily.    [provider]  valACYclovir (VALTREX) 1000 MG tablet Take 500 mg by mouth daily as needed (fever blisters/cold sores).  03/09/19   [provider]  verapamil (CALAN-SR) 180 MG CR tablet Take 1 tablet (180 mg total) by mouth at bedtime. 12/31/22   Yates Decamp, MD      Allergies     Patient has no known allergies.    Review of Systems   Review of Systems  Constitutional:  Negative for chills and fever.  HENT:  Negative for ear pain and sore throat.   Eyes:  Negative for pain and visual disturbance.  Respiratory:  Negative for cough and shortness of breath.   Cardiovascular:  Negative for chest pain and palpitations.  Gastrointestinal:  Negative for abdominal pain and vomiting.  Genitourinary:  Negative for dysuria and hematuria.  Musculoskeletal:  Negative for arthralgias and back pain.  Skin:  Negative for color change and rash.  Neurological:  Positive for speech difficulty. Negative for seizures and syncope.  All other systems reviewed and are negative.   Physical Exam Updated Vital Signs BP (!) 166/91   Pulse (!) 142   Temp 98.6 F (37 C) (Oral)   Resp 19   SpO2 99%  Physical Exam Vitals and nursing note reviewed.  Constitutional:      General: He is not in acute distress.    Appearance: He is well-developed.  HENT:     Head: Normocephalic and atraumatic.  Eyes:     General: Lids are normal. Vision grossly intact.     Conjunctiva/sclera: Conjunctivae normal.     Pupils: Pupils are equal, round, and reactive to light.  Cardiovascular:     Rate and Rhythm: Normal rate and regular rhythm.     Heart sounds: No murmur heard. Pulmonary:     Effort: Pulmonary effort is  normal. No respiratory distress.     Breath sounds: Normal breath sounds.  Abdominal:     Palpations: Abdomen is soft.     Tenderness: There is no abdominal tenderness.  Musculoskeletal:        General: No swelling.     Cervical back: Neck supple.  Skin:    General: Skin is warm and dry.     Capillary Refill: Capillary refill takes less than 2 seconds.  Neurological:     Mental Status: He is alert and oriented to person, place, and time.     GCS: GCS eye subscore is 4. GCS verbal subscore is 5. GCS motor subscore is 6.     Cranial Nerves: Cranial nerves 2-12 are intact.      Sensory: Sensation is intact.     Motor: Motor function is intact.     Coordination: Coordination is intact.  Psychiatric:        Mood and Affect: Mood normal.     ED Results / Procedures / Treatments   Labs (all labs ordered are listed, but only abnormal results are displayed) Labs Reviewed  COMPREHENSIVE METABOLIC PANEL - Abnormal; Notable for the following components:      Result Value   Glucose, Bld 107 (*)    AST 14 (*)    All other components within normal limits  CBC  ETHANOL  PROTIME-INR  APTT  CBC  DIFFERENTIAL  COMPREHENSIVE METABOLIC PANEL  RAPID URINE DRUG SCREEN, HOSP PERFORMED  URINALYSIS, ROUTINE W REFLEX MICROSCOPIC  HEMOGLOBIN A1C  CBG MONITORING, ED  I-STAT CHEM 8, ED    EKG EKG Interpretation Date/Time:  Thursday January 02 2023 11:42:33 EST Ventricular Rate:  103 PR Interval:    QRS Duration:  80 QT Interval:  352 QTC Calculation: 461 R Axis:   55  Text Interpretation: Atrial fibrillation with rapid ventricular response Abnormal ECG When compared with ECG of 31-Dec-2022 10:25, PREVIOUS ECG IS PRESENT Confirmed by Fulton Reek [phone]) on 01/02/2023 12:25:20 PM  Radiology CT ANGIO HEAD NECK W WO CM (CODE STROKE) Result Date: 01/02/2023 CLINICAL DATA:  Aphasia, stroke suspected EXAM: CT ANGIOGRAPHY HEAD AND NECK WITH AND WITHOUT CONTRAST TECHNIQUE: Multidetector CT imaging of the head and neck was performed using the standard protocol during bolus administration of intravenous contrast. Multiplanar CT image reconstructions and MIPs were obtained to evaluate the vascular anatomy. Carotid stenosis measurements (when applicable) are obtained utilizing NASCET criteria, using the distal internal carotid diameter as the denominator. RADIATION DOSE REDUCTION: This exam was performed according to the departmental dose-optimization program which includes automated exposure control, adjustment of the mA and/or kV according to patient size and/or use of  iterative reconstruction technique. CONTRAST:  75mL OMNIPAQUE IOHEXOL 350 MG/ML SOLN COMPARISON:  None Available. FINDINGS: CT HEAD FINDINGS For noncontrast findings, please see same day CT head. CTA NECK FINDINGS Aortic arch: Two-vessel arch with a common origin of the brachiocephalic and left common carotid arteries. Imaged portion shows no evidence of aneurysm or dissection. No significant stenosis of the major arch vessel origins. Mild aortic atherosclerosis. Right carotid system: No evidence of dissection, occlusion, or hemodynamically significant stenosis (greater than 50%). Left carotid system: No evidence of dissection, occlusion, or hemodynamically significant stenosis (greater than 50%). Vertebral arteries: No evidence of dissection, occlusion, or hemodynamically significant stenosis (greater than 50%). Patent right Skeleton: No acute osseous abnormality. Degenerative changes in the cervical spine. Other neck: No acute finding. Upper chest: No focal pulmonary opacity or pleural effusion. Review of the MIP  images confirms the above findings CTA HEAD FINDINGS Anterior circulation: Both internal carotid arteries are patent to the termini, without significant stenosis. A1 segments patent. Normal anterior communicating artery. Anterior cerebral arteries are patent to their distal aspects without significant stenosis. No M1 stenosis or occlusion. MCA branches perfused to their distal aspects without significant stenosis. Posterior circulation: Vertebral arteries patent to the vertebrobasilar junction without significant stenosis. Posterior inferior cerebellar arteries patent proximally. Basilar patent to its distal aspect without significant stenosis. Superior cerebellar arteries patent proximally. P1. Aplastic left P1. Fetal origin of the left PCA from the left posterior communicating artery. PCAs perfused to their distal aspects without significant stenosis. The right posterior communicating artery is also  patent. Venous sinuses: As permitted by contrast timing, patent. Anatomic variants: Fetal origin of the left PCA. No evidence of aneurysm or vascular malformation. Review of the MIP images confirms the above findings IMPRESSION: 1. No intracranial large vessel occlusion or significant stenosis. 2. No hemodynamically significant stenosis in the neck. 3. Aortic atherosclerosis. Aortic Atherosclerosis (ICD10-I70.0). Electronically Signed   By: Wiliam Ke M.D.   On: 01/02/2023 13:16   CT HEAD CODE STROKE WO CONTRAST Result Date: 01/02/2023 CLINICAL DATA:  Code stroke.  Neuro deficit, acute, stroke suspected EXAM: CT HEAD WITHOUT CONTRAST TECHNIQUE: Contiguous axial images were obtained from the base of the skull through the vertex without intravenous contrast. RADIATION DOSE REDUCTION: This exam was performed according to the departmental dose-optimization program which includes automated exposure control, adjustment of the mA and/or kV according to patient size and/or use of iterative reconstruction technique. COMPARISON:  None Available. FINDINGS: Brain: No hemorrhage. No hydrocephalus. No extra-axial fluid collection. No CT evidence of an acute cortical infarct. No mass effect. No mass lesion. Vascular: No hyperdense vessel or unexpected calcification. Skull: Normal. Negative for fracture or focal lesion. Sinuses/Orbits: No middle ear or mastoid effusion. Paranasal sinuses clear. Orbits are unremarkable. Other: None. ASPECTS [facility] Stroke Program Early CT Score): 10 IMPRESSION: No hemorrhage or CT evidence of an acute cortical infarct. ASPECTS 10. Findings were paged to Dr. Selina Cooley on 01/02/23 at 12:50 PM. Electronically Signed   By: Lorenza Cambridge M.D.   On: 01/02/2023 12:50    Procedures .Critical Care  Performed by: Franne Forts, DO Authorized by: Franne Forts, DO   Critical care provider statement:    Critical care time (minutes):  30   Critical care was necessary to treat or prevent imminent  or life-threatening deterioration of the following conditions: stroke.   Critical care was time spent personally by me on the following activities:  Development of treatment plan with patient or surrogate, discussions with consultants, evaluation of patient's response to treatment, examination of patient, ordering and review of laboratory studies, ordering and review of radiographic studies, ordering and performing treatments and interventions, pulse oximetry, re-evaluation of patient's condition and review of old charts     Medications Ordered in ED Medications  clevidipine (CLEVIPREX) infusion 0.5 mg/mL (has no administration in time range)   stroke: early stages of recovery book (has no administration in time range)  0.9 %  sodium chloride infusion (has no administration in time range)  acetaminophen (TYLENOL) tablet 650 mg (has no administration in time range)    Or  acetaminophen (TYLENOL) 160 MG/5ML solution 650 mg (has no administration in time range)    Or  acetaminophen (TYLENOL) suppository 650 mg (has no administration in time range)  senna-docusate (Senokot-S) tablet 1 tablet (has no administration in time range)  pantoprazole (PROTONIX) injection 40 mg (has no administration in time range)  tenecteplase (TNKASE) injection for Stroke 25 mg (25 mg Intravenous Given 01/02/23 1255)  iohexol (OMNIPAQUE) 350 MG/ML injection 75 mL (75 mLs Intravenous Contrast Given 01/02/23 1259)  hydrALAZINE (APRESOLINE) injection 10 mg (10 mg Intravenous Given 01/02/23 1310)    Followed by  hydrALAZINE (APRESOLINE) injection 10 mg (10 mg Intravenous Given 01/02/23 1256)    ED Course/ Medical Decision Making/ A&P Clinical Course as of 01/02/23 1346  Thu Jan 02, 2023  1224 Evaluated patient, gross aphasia x 2 hours, activating code stroke. [JD]    Clinical Course User Index [JD] Laurence Spates, MD                                 Medical Decision Making Amount and/or Complexity of Data  Reviewed Labs: ordered. Radiology: ordered.  Risk Decision regarding hospitalization.    72 year old male with past medical history of A-fib, recent right sided total knee repair, and hypertension presenting for speech changes.  Alert oriented x 3, no acute distress, afebrile, stable vital signs.  Patient was at the bridge and taken directly to CT.  CT demonstrates no acute hemorrhages.  No LVO.  Patient got TNK with neurology team.  I for saw patient at the bedside after TNK.  He is neurovascularly intact at this time.  Had some dysarthria that was transient.  Otherwise neurovascularly intact.  Of note, right knee is warm and erythematous.  However patient has no tenderness and full range of motion.  No leukocytosis.  Warmth likely secondary to swelling from recent knee repair.  Unlikely to be septic joint at this time due to no tenderness.  Neurology admitting.        Final Clinical Impression(s) / ED Diagnoses Final diagnoses:  Cerebrovascular accident (CVA), unspecified mechanism [facility])    Rx / DC Orders ED Discharge Orders     None         Franne Forts, DO 01/02/23 1346    Edwin Dada P, DO 01/02/23 1347

## 2023-01-02 NOTE — H&P (Addendum)
NEUROLOGY H&P NOTE   Date of service: January 02, 2023 Patient Name: Steve Murphy MRN:  [id number] DOB:  02/10/1950 Chief Complaint: "I can't talk"  History of Present Illness  Steve Murphy is a 72 y.o. male  has a past medical history of Arthritis, Fracture, Hypertension, Low testosterone level in male, PAC (premature atrial contraction), Ringing in ears, and Sexually transmitted disease.  Patient presented to the ED 01/02/23 for evaluation of acute onset of speech disturbance today while at work.  The patient states that he woke up in his usual state of health and was speaking regularly this morning but at approximately 10:40 this morning, he was having trouble with word-finding.  Patient states that he underwent a right knee arthroplasty 11/06/22 and that he was recently diagnosed with permanent atrial fibrillation for which he was supposed to start Xarelto but that the prescription had not yet arrived to start.   Last known well: 10:40 AM today, 12/19 Modified rankin score: 0-Completely asymptomatic and back to baseline post- stroke IV Thrombolysis: Yes, discussed the risks, benefits, and alternative treatments with patient at bedside while in CT.  Patient denies recent bleeding, strokes, or surgical procedures other than his surgery 10/23 on his right knee and patient wished to proceed with TNKase administration.   Thrombectomy: No, patient's presentation is not consistent with an LVO.   NIHSS components Score: Comment  1a Level of Conscious 0[x]  1[]  2[]  3[]      1b LOC Questions 0[x]  1[]  2[]       1c LOC Commands 0[x]  1[]  2[]       2 Best Gaze 0[x]  1[]  2[]       3 Visual 0[x]  1[]  2[]  3[]      4 Facial Palsy 0[x]  1[]  2[]  3[]      5a Motor Arm - left 0[x]  1[]  2[]  3[]  4[]  UN[]    5b Motor Arm - Right 0[x]  1[]  2[]  3[]  4[]  UN[]    6a Motor Leg - Left 0[x]  1[]  2[]  3[]  4[]  UN[]    6b Motor Leg - Right 0[x]  1[]  2[]  3[]  4[]  UN[]    7 Limb Ataxia 0[x]  1[]  2[]  3[]  UN[]     8 Sensory 0[x]  1[]  2[]   UN[]      9 Best Language 0[]  1[x]  2[]  3[]      10 Dysarthria 0[x]  1[]  2[]  UN[]      11 Extinct. and Inattention 0[x]  1[]  2[]       TOTAL: 1     ROS  Comprehensive ROS performed and pertinent positives documented in the HPI  Past History   Past Medical History:  Diagnosis Date   Arthritis    L hip    Fracture    Hypertension    pt. runs a tad hypertensive, states that he "turned down" medicine ( RX ) from PCP    Low testosterone level in male    PAC (premature atrial contraction)    Ringing in ears    Sexually transmitted disease    Past Surgical History:  Procedure Laterality Date   APPENDECTOMY     EYE SURGERY Bilateral    lasik   HERNIA REPAIR Bilateral    inguinal    SHOULDER ARTHROSCOPY Right    TONSILLECTOMY     TOTAL HIP ARTHROPLASTY Left 02/04/2014   dr Gerri Spore   TOTAL HIP ARTHROPLASTY Left 02/04/2014   Procedure: LEFT TOTAL HIP ARTHROPLASTY ANTERIOR APPROACH;  Surgeon: Loanne Drilling, MD;  Location: [facility] OR;  Service: Orthopedics;  Laterality: Left;   Family History  Problem Relation Age of  Onset   Pancreatic cancer Mother    Other Father        Gunshot wound   Arthritis Sister    Arthritis Sister    Social History   Socioeconomic History   Marital status: Married    Spouse name: Not on file   Number of children: Not on file   Years of education: Not on file   Highest education level: Not on file  Occupational History   Not on file  Tobacco Use   Smoking status: Never   Smokeless tobacco: Never  Vaping Use   Vaping status: Never Used  Substance and Sexual Activity   Alcohol use: Yes    Comment: socialy - seldom   Drug use: No   Sexual activity: Not on file  Other Topics Concern   Not on file  Social History Narrative   Not on file   Social Drivers of Health   Financial Resource Strain: Not on file  Food Insecurity: Not on file  Transportation Needs: Not on file  Physical Activity: Not on file  Stress: Not on file  Social Connections: Not  on file   No Known Allergies  Medications   Current Facility-Administered Medications:    [START ON 01/03/2023]  stroke: early stages of recovery book, , Does not apply, Once, Toberman, Stevi W, NP   0.9 %  sodium chloride infusion, , Intravenous, Continuous, Toberman, Stevi W, NP   acetaminophen (TYLENOL) tablet 650 mg, 650 mg, Oral, Q4H PRN **OR** acetaminophen (TYLENOL) 160 MG/5ML solution 650 mg, 650 mg, Per Tube, Q4H PRN **OR** acetaminophen (TYLENOL) suppository 650 mg, 650 mg, Rectal, Q4H PRN, Kara Mead, NP   clevidipine (CLEVIPREX) infusion 0.5 mg/mL, 0-21 mg/hr, Intravenous, Continuous, Davis, Elenor Quinones, MD   pantoprazole (PROTONIX) injection 40 mg, 40 mg, Intravenous, QHS, Toberman, Stevi W, NP   senna-docusate (Senokot-S) tablet 1 tablet, 1 tablet, Oral, QHS PRN, Kara Mead, NP   tenecteplase (TNKASE) injection for Stroke 25 mg, 0.25 mg/kg, Intravenous, Once, Daylene Posey, [facility]  Current Outpatient Medications:    acetaminophen (TYLENOL) 500 MG tablet, Take 1,000 mg by mouth every 6 (six) hours as needed (pain.)., Disp: , Rfl:    amoxicillin (AMOXIL) 500 MG tablet, Take 500 mg by mouth 2 (two) times daily., Disp: , Rfl:    rivaroxaban (XARELTO) 20 MG TABS tablet, Take 1 tablet (20 mg total) by mouth daily with supper., Disp: 90 tablet, Rfl: 1   tamsulosin (FLOMAX) 0.4 MG CAPS capsule, Take 0.4 mg by mouth daily., Disp: , Rfl:    valACYclovir (VALTREX) 1000 MG tablet, Take 500 mg by mouth daily as needed (fever blisters/cold sores). , Disp: , Rfl:    verapamil (CALAN-SR) 180 MG CR tablet, Take 1 tablet (180 mg total) by mouth at bedtime., Disp: 30 tablet, Rfl: 2   Vitals   Vitals:   01/02/23 1137  BP: (!) 131/95  Pulse: 95  Resp: 18  Temp: 98.1 F (36.7 C)  TempSrc: Oral  SpO2: 99%     There is no height or weight on file to calculate BMI.  Physical Exam   Constitutional: Well-developed and well-nourished Caucasian male in no acute distress Psych:  Affect appropriate to situation. Patient is calm and cooperative with exam Eyes: No scleral injection.  HENT: No OP obstruction.  Head: Normocephalic.  Cardiovascular: Normal rate and regular rhythm Respiratory: Effort normal, non-labored breathing on room air GI: Soft.  No distension. There is no tenderness.  Skin: WDI.  Neurologic Examination   Mental Status: Patient is awake, alert, oriented to person, place, month, year, and situation. Patient is able to give a clear and coherent history with some limited fluency due to word-finding difficulties.  He is able to name 5/6 objects on the stroke object card but struggled significantly with describing events in a picture.  Patient also had some difficulty intermittently with repetition.  Patient does have mild expressive aphasia throughout examination.  Cranial Nerves: II: Visual Fields are full. Pupils are equal, round, and reactive to light.   III,IV, VI: EOMI without ptosis or diploplia.  V: Facial sensation is intact and symmetric to light touch VII: Facial movement is symmetric resting and with movement VIII: Hearing is intact to voice X: Palate elevates symmetrically XI: Shoulder shrug is symmetric. XII: Tongue protrudes midline Motor: Tone is normal. Bulk is normal. 5/5 strength was present in all four extremities.  All extremities elevate antigravity without vertical drift.  Sensory: Sensation is symmetric to light touch and temperature in the arms and legs. No extinction to DSS present.  Cerebellar: FNF and HKS are intact bilaterally  Labs   CBC:  Recent Labs  Lab 12/31/22 1150 01/02/23 1137  WBC 6.7 6.2  HGB 15.0 14.4  HCT 45.5 44.2  MCV 94 94.6  PLT 246 220   Basic Metabolic Panel:  Lab Results  Component Value Date   NA 139 01/02/2023   K 3.7 01/02/2023   CO2 22 01/02/2023   GLUCOSE 107 (H) 01/02/2023   BUN 19 01/02/2023   CREATININE 1.16 01/02/2023   CALCIUM 9.1 01/02/2023   GFRNONAA >60 01/02/2023    GFRAA 80 (L) 02/06/2014   Lipid Panel:  Lab Results  Component Value Date   LDLCALC 86 10/31/2022   HgbA1c: No results found for: "HGBA1C" Urine Drug Screen: No results found for: "LABOPIA", "COCAINSCRNUR", "LABBENZ", "AMPHETMU", "THCU", "LABBARB"  Alcohol Level No results found for: "ETH" INR  Lab Results  Component Value Date   INR 0.98 01/24/2014   APTT  Lab Results  Component Value Date   APTT 26 01/24/2014   CT Head without contrast(Personally reviewed): No hemorrhage or CT evidence of an acute cortical infarct. ASPECTS 10.  CT angio Head and Neck with contrast(Personally reviewed): 1. No intracranial large vessel occlusion or significant stenosis. 2. No hemodynamically significant stenosis in the neck. 3. Aortic atherosclerosis.  Assessment   Steve Murphy is a 72 y.o. male with PMHx significant for essential hypertension, PAC, right knee arthoplasty 11/06/2022, and new diagnosis of persistent atrial fibrillation during perioperative cardiovascular risk assessment for right knee arthoplasty.  Patient was seen by outpatient cardiology 12/31/2022 with complaints of worsening SOB and decreased endurance over the last year and was subsequently started on Xarelto with a plan for cardioversion in 3 weeks but patient had not yet obtained his Xarelto prescription.  Patient was within his usual state of health and was at work at approximately 10:40 this morning when he noticed he was having trouble with word finding and came to the ED for further evaluation.  Neurologic exam is notable for isolated mild expressive aphasia, CTH was negative for hemorrhage, and after discussing the risks, benefits, and alternative treatments to [facility], the patient expressed that he wanted to receive TNKase treatment.   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  Secondary to permanent atrial fibrillation not on anticoagulation   Secondary  Diagnosis: Essential (primary) hypertension  Recommendations  Acuity: Acute Current Suspected  Etiology: atrial fibrillation not on AC Continue Evaluation:  -Admit to: ICU -Hold Aspirin until 24 hour post tPA neuroimaging is stable and without evidence of bleeding -Strict blood pressure control, goal of SYS < 180/105 for 24 hours post TNK -MRI/ECHO/A1C/Lipid panel. -Hyperglycemia management per SSI to maintain glucose 140-180mg /dL. -ST therapies and recommendations when able -Frequent neuro checks -Risk factor modification -Telemetry monitoring -Stroke team to follow ______________________________________________________________________  Signed, Kara Mead, NP [facility]  Risks, benefits and alternatives of IVT discussed with patient and they agreed.   STROKE MD NOTE :  I have personally obtained history,examined this patient, reviewed notes, independently viewed imaging studies, participated in medical decision making and plan of care.ROS completed by me personally and pertinent positives fully documented  I have made any additions or clarifications directly to the above note. Agree with note above.  Patient presented with sudden onset of expressive aphasia while at work today without any other accompanying symptoms.  He clearly is having trouble completing sentences and finding words but is good ability to comprehend and follow commands.  NIH stroke scale is 1 he was recently diagnosed with atrial fibrillation and was prescribed Xarelto but was unable to fill it to the pharmacy.  I have discussed with him risk benefits of IV thrombolysis with TNK including 4 to 6% risk of intracerebral hemorrhage and given his significant disability with expressive language difficulties he would like to proceed with TNK administration.  Will admit to neurological intensive care unit with strict blood pressure control and close neurological follow-up as per post TNK protocol.  CT head  shows no acute abnormality or hemorrhage and CT angiogram shows no large vessel stenosis or occlusion.  Discussed with patient and ER MD and answered questions. This patient is critically ill and at significant risk of neurological worsening, death and care requires constant monitoring of vital signs, hemodynamics,respiratory and cardiac monitoring, extensive review of multiple databases, frequent neurological assessment, discussion with family, other specialists and medical decision making of high complexity.I have made any additions or clarifications directly to the above note.This critical care time does not reflect procedure time, or teaching time or supervisory time of PA/NP/Med Resident etc but could involve care discussion time.  I spent 40 minutes of neurocritical care time  in the care of  this patient.      Delia Heady, MD Medical Director [facility] Pager: [phone] 01/02/2023 3:12 PM  [facility] personally reviewed prior to TNK administration

## 2023-01-02 NOTE — ED Notes (Signed)
Report given to [facility] ICU

## 2023-01-02 NOTE — Progress Notes (Addendum)
1555 entered in room for rounds, pt c/o 4/10 bilateral frontal headache with light sensitivity. NIH unchanged, no other complaints or deficits. Neuro notified; orders for MRI, if unable to obtain MRI then obtain CT at this time. (MRI able to be completed--order for CT changed for 24h post TNK).  1830 pt still with c/o headache. Orders obtained for another PRN, waiting for pharmacy to send.

## 2023-01-02 NOTE — Code Documentation (Signed)
Stroke Response Nurse Documentation Code Documentation  Steve Murphy is a 72 y.o. male arriving to [facility]  via [employer] on 01/02/23 with past medical hx of arthritis, HTN, R knee surgery this past Oct, PACs, AF. On No antithrombotic. Pt prescribed Xarelto a few days ago but has not received medication yet. Code stroke was activated by ED.   Patient from work where he was LKW at 1000 and now complaining of aphasia . Pt was at work where he was found by coworkers with difficulty speaking and word finding  Stroke team at the bedside. Labs drawn and patient cleared for CT. Patient to CT with team. NIHSS 1, see documentation for details and code stroke times. Patient with Expressive aphasia  on exam. The following imaging was completed:  CT Head and CTA. Patient is a candidate for IV Thrombolytic. TNK given at 1255 Patient is not a candidate for IR due to no LVO.   Care Plan: q41min vitals and neuro checks x2h, then q14min x6hr, then q1h x16hrs. BP goal <180/105   Bedside handoff with ED RN Fayrene Fearing.    Mordecai Rasmussen  Stroke Response RN

## 2023-01-02 NOTE — ED Triage Notes (Signed)
Pt lives at home, and is coming from work where the employees report he was having transient difficulty speaking, there was no other symptoms, no weakness, no sensation loss. Pt is not on blood thinners, just recently having the afib addressed so he is no on blood thinners.   Medic vitals   176/101 97%ra 101hr 18rr 141bgl  20g left hand

## 2023-01-03 ENCOUNTER — Inpatient Hospital Stay (HOSPITAL_COMMUNITY): Payer: Medicare Other

## 2023-01-03 ENCOUNTER — Telehealth (HOSPITAL_COMMUNITY): Payer: Self-pay

## 2023-01-03 ENCOUNTER — Other Ambulatory Visit (HOSPITAL_COMMUNITY): Payer: Self-pay

## 2023-01-03 LAB — COMPREHENSIVE METABOLIC PANEL
ALT: 12 U/L (ref 0–44)
AST: 15 U/L (ref 15–41)
Albumin: 3.2 g/dL — ABNORMAL LOW (ref 3.5–5.0)
Alkaline Phosphatase: 77 U/L (ref 38–126)
Anion gap: 7 (ref 5–15)
BUN: 14 mg/dL (ref 8–23)
CO2: 25 mmol/L (ref 22–32)
Calcium: 8.8 mg/dL — ABNORMAL LOW (ref 8.9–10.3)
Chloride: 109 mmol/L (ref 98–111)
Creatinine, Ser: 1.21 mg/dL (ref 0.61–1.24)
GFR, Estimated: >60 mL/min (ref >60)
Glucose, Bld: 92 mg/dL (ref 70–99)
Potassium: 3.9 mmol/L (ref 3.5–5.1)
Sodium: 141 mmol/L (ref 135–145)
Total Bilirubin: 1.7 mg/dL — ABNORMAL HIGH (ref <1.2)
Total Protein: 5.6 g/dL — ABNORMAL LOW (ref 6.5–8.1)

## 2023-01-03 LAB — LIPID PANEL
Cholesterol: 111 mg/dL (ref 0–200)
HDL: 38 mg/dL — ABNORMAL LOW (ref >40)
LDL Cholesterol: 58 mg/dL (ref 0–99)
Total CHOL/HDL Ratio: 2.9 {ratio}
Triglycerides: 74 mg/dL (ref <150)
VLDL: 15 mg/dL (ref 0–40)

## 2023-01-03 LAB — CBC
HCT: 42.1 % (ref 39.0–52.0)
Hemoglobin: 14 g/dL (ref 13.0–17.0)
MCH: 30.9 pg (ref 26.0–34.0)
MCHC: 33.3 g/dL (ref 30.0–36.0)
MCV: 92.9 fL (ref 80.0–100.0)
Platelets: 224 10*3/uL (ref 150–400)
RBC: 4.53 MIL/uL (ref 4.22–5.81)
RDW: 13.3 % (ref 11.5–15.5)
WBC: 6.8 10*3/uL (ref 4.0–10.5)
nRBC: 0 % (ref 0.0–0.2)

## 2023-01-03 MED ORDER — PANTOPRAZOLE SODIUM 40 MG PO TBEC: 40.0000 mg | DELAYED_RELEASE_TABLET | Freq: Every day | ORAL | Status: DC

## 2023-01-03 MED ORDER — APIXABAN 5 MG PO TABS
5.0000 mg | ORAL_TABLET | Freq: Two times a day (BID) | ORAL | Status: DC
Start: 2023-01-03 — End: 2023-01-03
  Administered 2023-01-03: 5 mg via ORAL
  Filled 2023-01-03: qty 1

## 2023-01-03 MED ORDER — APIXABAN 5 MG PO TABS
5.0000 mg | ORAL_TABLET | Freq: Two times a day (BID) | ORAL | 1 refills | Status: DC
  Filled 2023-01-03: qty 60, 30d supply, fill #0

## 2023-01-03 MED ORDER — CHLORHEXIDINE GLUCONATE CLOTH 2 % EX PADS: 6.0000 | MEDICATED_PAD | Freq: Every day | CUTANEOUS | Status: DC

## 2023-01-03 NOTE — Discharge Instructions (Addendum)
Steve Murphy, you were admitted with mild expressive aphasia.  Your symptoms were thought to be due to a stroke, and you were given TNK to treat this.  Your brain MRI did show a small stroke.  You will need to start Eliquis for stroke prevention since you have atrial fibrillation.  Please see your PCP within one week of discharge and follow up with Dr. Pearlean Brownie in the stroke clinic.

## 2023-01-03 NOTE — Progress Notes (Signed)
PT Cancellation Note  Patient Details Name: Steve Murphy MRN: [id number] DOB: 06-27-1950   Cancelled Treatment:    Reason Eval/Treat Not Completed: PT screened, no needs identified, will sign off; patient reports at his baseline and RN confers.     Elray Mcgregor 01/03/2023, 12:08 PM Sheran Lawless, PT Acute Rehabilitation Services Office:[phone] 01/03/2023

## 2023-01-03 NOTE — Telephone Encounter (Signed)
Pharmacy Patient Advocate Encounter  Insurance verification completed.    The patient is insured through [employer]. Patient has Medicare and is not eligible for a copay card, but may be able to apply for patient assistance, if available.    Ran test claim for Eliquis and the current 30 day co-pay is $314.79.  Ran test claim for Xarelto and the current 30 day co-pay is $310.88.  This test claim was processed through [facility]- copay amounts may vary at other pharmacies due to pharmacy/plan contracts, or as the patient moves through the different stages of their insurance plan.

## 2023-01-03 NOTE — Progress Notes (Signed)
OT Cancellation Note  Patient Details Name: Steve Murphy MRN: [id number] DOB: 09/17/1950   Cancelled Treatment:    Reason Eval/Treat Not Completed: OT screened, no needs identified, will sign off (Per PT, pt is back to baseline and does not have acute OT needs, will sign off. Thank you.)  Donia Pounds 01/03/2023, 12:29 PM

## 2023-01-03 NOTE — Progress Notes (Signed)
OT Cancellation Note  Patient Details Name: BARNETT PRIDEAUX MRN: [id number] DOB: 23-Sep-1950   Cancelled Treatment:    Reason Eval/Treat Not Completed: Active bedrest order (Pt received TNK at 1255 on 12/19; bed rest for 24 horus. OT to f/u when appropriate.)  Donia Pounds 01/03/2023, 7:55 AM

## 2023-01-03 NOTE — Plan of Care (Signed)
Patient discharging post TNK. Patient has no questions, Eliquis given prior to discharge. VSS, IVs removed. Patient will be transported home by sister.

## 2023-01-03 NOTE — Discharge Summary (Addendum)
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   amoxicillin 500 MG tablet Commonly known as: AMOXIL Take 500 mg by mouth daily.   apixaban 5 MG Tabs tablet Commonly known as: ELIQUIS Take 1 tablet (5 mg total) by mouth 2 (two) times daily.   tamsulosin 0.4 MG Caps capsule Commonly known as: FLOMAX Take 0.4 mg by mouth daily.   verapamil 180 MG CR tablet Commonly known as: CALAN-SR Take 1 tablet (180 mg total) by mouth at bedtime.   VITAMIN B-12 PO Take 1 Piece by mouth daily. Chewables        LABORATORY STUDIES CBC    Component Value Date/Time   WBC 6.8 01/03/2023 0556   RBC 4.53 01/03/2023 0556   HGB 14.0 01/03/2023 0556   HGB 15.0 12/31/2022 1150   HCT 42.1 01/03/2023 0556   HCT 45.5 12/31/2022 1150   PLT 224 01/03/2023 0556   PLT 246 12/31/2022 1150   MCV 92.9 01/03/2023 0556   MCV 94 12/31/2022 1150   MCH 30.9 01/03/2023 0556   MCHC 33.3 01/03/2023 0556   RDW 13.3 01/03/2023 0556   RDW 11.9 12/31/2022 1150   LYMPHSABS 1.3 01/02/2023 1457   MONOABS  0.5 01/02/2023 1457   EOSABS 0.1 01/02/2023 1457   BASOSABS 0.0 01/02/2023 1457   CMP    Component Value Date/Time   NA 141 01/03/2023 0556   NA 144 12/31/2022 1150   K 3.9 01/03/2023 0556   CL 109 01/03/2023 0556   CO2 25 01/03/2023 0556   GLUCOSE 92 01/03/2023 0556   BUN 14 01/03/2023 0556   BUN 20 12/31/2022 1150   CREATININE 1.21 01/03/2023 0556   CALCIUM 8.8 (L) 01/03/2023 0556   PROT 5.6 (L) 01/03/2023 0556   ALBUMIN 3.2 (L) 01/03/2023 0556   AST 15 01/03/2023 0556   ALT 12 01/03/2023 0556   ALKPHOS 77 01/03/2023 0556   BILITOT 1.7 (H) 01/03/2023 0556   GFRNONAA >60 01/03/2023 0556   GFRAA 80 (L) 02/06/2014 0514   COAGS Lab Results  Component Value Date   INR 1.1 01/02/2023   INR 0.98 01/24/2014   Lipid Panel    Component Value Date/Time   CHOL 111 01/03/2023 0556   CHOL 146 10/31/2022 1224   TRIG 74 01/03/2023 0556   HDL 38 (L) 01/03/2023 0556   HDL 44 10/31/2022 1224   CHOLHDL 2.9 01/03/2023 0556   VLDL 15 01/03/2023 0556   LDLCALC 58 01/03/2023 0556   LDLCALC 86 10/31/2022 1224  HgbA1C  Lab Results  Component Value Date   HGBA1C 5.0 01/02/2023   Urine Drug Screen negative Alcohol Level    Component Value Date/Time   ETH <10 01/02/2023 1457     SIGNIFICANT DIAGNOSTIC STUDIES CT HEAD WO CONTRAST ( ) Result Date: 01/03/2023 CLINICAL DATA:  Stroke, follow up 24h post TNK EXAM: CT HEAD WITHOUT CONTRAST TECHNIQUE: Contiguous axial images were obtained from the base of the skull through the vertex without intravenous contrast. RADIATION DOSE REDUCTION: This exam was performed according to the departmental dose-optimization program which includes automated exposure control, adjustment of the mA and/or kV according to patient size and/or use of iterative reconstruction technique. COMPARISON:  CT head and MRI head 01/02/2023. FINDINGS: Brain: Known small acute left frontal lobe infarct not well seen by CT. No evidence of acute hemorrhage, mass lesion,  midline shift or hydrocephalus. Vascular: Calcific atherosclerosis. Skull: No acute fracture. Sinuses/Orbits: Clear sinuses.  No acute orbital findings. Other: No mastoid effusions. IMPRESSION: 1. No evidence of acute hemorrhage or progressive mass effect. 2. Small infarct better characterized on recent MRI. Electronically Signed   By: Feliberto Harts M.D.   On: 01/03/2023 12:05   MR BRAIN WO CONTRAST Result Date: 01/02/2023 CLINICAL DATA:  Stroke suspected EXAM: MRI HEAD WITHOUT CONTRAST TECHNIQUE: Multiplanar, multiecho pulse sequences of the brain and surrounding structures were obtained without intravenous contrast. COMPARISON:  No prior MRI head, correlation is made with CT head 01/02/2023 FINDINGS: Brain: Linear restricted diffusion with ADC correlate in the left posterior frontal cortex (series 2, images 39-40). This is not associated with significantly increased T2 hyperintense signal. Small focus of adjacent hemosiderin deposition (series 7, image 82) may indicate focal clot or be related to more remote hemorrhage. No evidence of acute hemorrhage, mass, mass effect, or midline shift. No hydrocephalus or extra-axial collection. Pituitary and craniocervical junction within normal limits. Mildly advanced cerebral volume loss for age. T2 hyperintense signal in the periventricular white matter, likely the sequela of mild chronic small vessel ischemic disease. Vascular: Normal arterial flow voids. Skull and upper cervical spine: Normal marrow signal. Sinuses/Orbits: Clear paranasal sinuses. No acute finding in the orbits. Other: The mastoid air cells are well aerated. IMPRESSION: Acute infarct in the left posterior frontal cortex. Small focus of adjacent hemosiderin deposition may indicate focal clot or be related to more remote hemorrhage. Electronically Signed   By: Wiliam Ke M.D.   On: 01/02/2023 18:14   CT ANGIO HEAD NECK W WO CM (CODE STROKE) Result Date: 01/02/2023 CLINICAL DATA:  Aphasia,  stroke suspected EXAM: CT ANGIOGRAPHY HEAD AND NECK WITH AND WITHOUT CONTRAST TECHNIQUE: Multidetector CT imaging of the head and neck was performed using the standard protocol during bolus administration of intravenous contrast. Multiplanar CT image reconstructions and MIPs were obtained to evaluate the vascular anatomy. Carotid stenosis measurements (when applicable) are obtained utilizing NASCET criteria, using the distal internal carotid diameter as the denominator. RADIATION DOSE REDUCTION: This exam was performed according to the departmental dose-optimization program which includes automated exposure control, adjustment of the mA and/or kV according to patient size and/or use of iterative reconstruction technique. CONTRAST:  75mL OMNIPAQUE IOHEXOL 350 MG/ML SOLN COMPARISON:  None Available. FINDINGS: CT HEAD FINDINGS For noncontrast findings, please see same day CT head. CTA NECK FINDINGS Aortic arch: Two-vessel arch with a common origin of the brachiocephalic and left common carotid arteries. Imaged portion shows no evidence of aneurysm or dissection. No significant stenosis of the major arch vessel origins. Mild aortic atherosclerosis. Right  carotid system: No evidence of dissection, occlusion, or hemodynamically significant stenosis (greater than 50%). Left carotid system: No evidence of dissection, occlusion, or hemodynamically significant stenosis (greater than 50%). Vertebral arteries: No evidence of dissection, occlusion, or hemodynamically significant stenosis (greater than 50%). Patent right Skeleton: No acute osseous abnormality. Degenerative changes in the cervical spine. Other neck: No acute finding. Upper chest: No focal pulmonary opacity or pleural effusion. Review of the MIP images confirms the above findings CTA HEAD FINDINGS Anterior circulation: Both internal carotid arteries are patent to the termini, without significant stenosis. A1 segments patent. Normal anterior communicating artery.  Anterior cerebral arteries are patent to their distal aspects without significant stenosis. No M1 stenosis or occlusion. MCA branches perfused to their distal aspects without significant stenosis. Posterior circulation: Vertebral arteries patent to the vertebrobasilar junction without significant stenosis. Posterior inferior cerebellar arteries patent proximally. Basilar patent to its distal aspect without significant stenosis. Superior cerebellar arteries patent proximally. P1. Aplastic left P1. Fetal origin of the left PCA from the left posterior communicating artery. PCAs perfused to their distal aspects without significant stenosis. The right posterior communicating artery is also patent. Venous sinuses: As permitted by contrast timing, patent. Anatomic variants: Fetal origin of the left PCA. No evidence of aneurysm or vascular malformation. Review of the MIP images confirms the above findings IMPRESSION: 1. No intracranial large vessel occlusion or significant stenosis. 2. No hemodynamically significant stenosis in the neck. 3. Aortic atherosclerosis. Aortic Atherosclerosis (ICD10-I70.0). Electronically Signed   By: Wiliam Ke M.D.   On: 01/02/2023 13:16   CT HEAD CODE STROKE WO CONTRAST Result Date: 01/02/2023 CLINICAL DATA:  Code stroke.  Neuro deficit, acute, stroke suspected EXAM: CT HEAD WITHOUT CONTRAST TECHNIQUE: Contiguous axial images were obtained from the base of the skull through the vertex without intravenous contrast. RADIATION DOSE REDUCTION: This exam was performed according to the departmental dose-optimization program which includes automated exposure control, adjustment of the mA and/or kV according to patient size and/or use of iterative reconstruction technique. COMPARISON:  None Available. FINDINGS: Brain: No hemorrhage. No hydrocephalus. No extra-axial fluid collection. No CT evidence of an acute cortical infarct. No mass effect. No mass lesion. Vascular: No hyperdense vessel or  unexpected calcification. Skull: Normal. Negative for fracture or focal lesion. Sinuses/Orbits: No middle ear or mastoid effusion. Paranasal sinuses clear. Orbits are unremarkable. Other: None. ASPECTS [facility] Stroke Program Early CT Score): 10 IMPRESSION: No hemorrhage or CT evidence of an acute cortical infarct. ASPECTS 10. Findings were paged to Dr. Selina Cooley on 01/02/23 at 12:50 PM. Electronically Signed   By: Lorenza Cambridge M.D.   On: 01/02/2023 12:50       HISTORY OF PRESENT ILLNESS 72 y.o. patient with history of hypertension, recent right knee arthroplasty and persistent atrial fibrillation was admitted with mild expressive aphasia.  HOSPITAL COURSE Patient was given TNK to treat his stroke.  He was recently diagnosed with atrial fibrillation but had not yet been able to receive his prescribed Xarelto.  He was treated with IV TNK with near complete resolution of his symptoms.  He was monitored in the ICU and remained stable.  Blood pressure adequately controlled.  He was found to have an acute ischemic infarct in the left posterior frontal cortex on MRI.  24-hour CT scan demonstrates no hemorrhage, and patient was started on Eliquis for his atrial fibrillation and Cardizem for rate control.  He is advised to follow-up with his cardiologist Dr. Jacinto Halim.  Patient also advised to consider possible participation in  the [place] AF study if interested..  Acute Ischemic Infarct:  left posterior frontal cortex infarct s/p TNK Etiology: Cardioembolic from atrial fibrillation Code Stroke CT head No acute abnormality. ASPECTS 10.    CTA head & neck no LVO or significant stenosis MRI acute ischemic infarct in the left posterior frontal cortex 2D Echo EF 50 to 55%, left atrium mildly dilated, no atrial level shunt LDL 58 HgbA1c 5.0 VTE prophylaxis -fully anticoagulated with Eliquis No antithrombotic prior to admission, now on Eliquis (apixaban) daily  Therapy recommendations:  No follow up needed   Disposition: Home   Atrial fibrillation Home Meds: Verapamil 180 mg daily Continue telemetry monitoring Begin anticoagulation with Eliquis  Hypertension Home meds: None Stable Blood Pressure Goal: BP less than 180/105   Hyperlipidemia Home meds: None LDL 58, goal < 70 High intensity statin not indicated as LDL below goal  RN Pressure Injury Documentation:     DISCHARGE EXAM  PHYSICAL EXAM General:  Alert, well-nourished, well-developed pleasant Asian male in no acute distress Psych:  Mood and affect appropriate for situation CV: Regular rate and rhythm on monitor Respiratory:  Regular, unlabored respirations on room air GI: Abdomen soft and nontender  NEURO:  Mental Status: AA&Ox3  Speech/Language: speech is without dysarthria or aphasia.  Naming, repetition, fluency, and comprehension intact.  Able to name 6 animals with 4 feet when asked  Cranial Nerves:  II: PERRL. Visual fields full.  III, IV, VI: EOMI. Eyelids elevate symmetrically.  V: Sensation is intact to light touch and symmetrical to face.  VII: Smile is symmetrical.  VIII: hearing intact to voice. IX, X: Phonation is normal.  XII: tongue is midline without fasciculations. Motor: 5/5 strength to all muscle groups tested.  Tone: is normal and bulk is normal Sensation- Intact to light touch bilaterally.  Coordination: FTN intact bilaterally Gait- deferred  NIHSS 0  Discharge Diet       Diet   Diet Heart Room service appropriate? Yes; Fluid consistency: Thin   liquids  DISCHARGE PLAN Disposition: Home Eliquis (apixaban) daily for secondary stroke prevention indefinitely Ongoing stroke risk factor control by Primary Care Physician at time of discharge Follow-up PCP Lance Bosch, NP in 2 weeks. Follow-up in [facility] in 8 weeks, office to schedule an appointment. Consider possible participation in [place] AF study (Eliquis versus Milvexian-factor 11  inhibitor ) .  Patient given information to review at home and decide  35 minutes were spent preparing discharge.  Cortney E Ernestina Columbia , MSN, AGACNP-BC [facility] See Amion for schedule and pager information 01/03/2023 1:08 PM  I have personally obtained history,examined this patient, reviewed notes, independently viewed imaging studies, participated in medical decision making and plan of care.ROS completed by me personally and pertinent positives fully documented  I have made any additions or clarifications directly to the above note. Agree with note above.  Patient presented with sudden onset of aphasia due to left MCA branch infarct secondary to embolism from new onset A-fib for which she had been prescribed Xarelto but had not yet started.  He was treated with IV TNK with excellent clinical recovery.  He was started back on Eliquis and Cardizem.  He was also given information to review consider possible participation in [place] AF study if interested.  Delia Heady, MD Medical Director [facility] Pager: [phone] 01/03/2023 3:04 PM

## 2023-01-06 ENCOUNTER — Encounter: Payer: Self-pay | Admitting: Cardiology

## 2023-01-06 NOTE — Telephone Encounter (Signed)
Continue what you are taking (Eliquis - Apixiban) , will wait on cardioversion for 4-6 weeks. I want to see him or AF clinic to see him first. Agree with cancelling cardioversion. I messaged the patient about cancellation

## 2023-01-07 NOTE — Telephone Encounter (Signed)
Patient was recently hospitalized.  He will see Jari Favre, PA as scheduled. Follow up with Dr Jacinto Halim can be arranged after this visit

## 2023-01-07 NOTE — Telephone Encounter (Signed)
Reviewed with Dr Jacinto Halim and in stead of afib clinic appointment patient can keep previously scheduled appointment with Jari Favre, PA on February 10.  TEE/cardioversion can be scheduled at that time.

## 2023-01-10 LAB — LAB REPORT - SCANNED: EGFR: 62

## 2023-01-11 NOTE — Progress Notes (Signed)
Labs 01/09/2023:  Hb 15.0/HCT 46.6, platelets 244, normal indicis.  Serum glucose 96 mg, BUN 14, creatinine 1.23, EGFR 62 mL, sodium 149, potassium 4.9.  Alkaline phosphatase minimally elevated at 135, LFTs otherwise normal.

## 2023-01-20 ENCOUNTER — Other Ambulatory Visit (HOSPITAL_COMMUNITY): Payer: Self-pay | Admitting: Pharmacist

## 2023-01-20 MED ORDER — STUDY - LIBREXIA-AF - APIXABAN 5 MG OR PLACEBO CAPSULE (PI-SETHI): 5.0000 mg | ORAL_CAPSULE | Freq: Two times a day (BID) | ORAL | 0 refills | Status: DC

## 2023-01-20 MED ORDER — STUDY - LIBREXIA-AF - JNJ-70033093 (MILVEXIAN) 100 MG OR PLACEBO (PI-SETHI): 1.0000 | ORAL_TABLET | Freq: Two times a day (BID) | ORAL | 0 refills | Status: DC

## 2023-01-23 ENCOUNTER — Ambulatory Visit (HOSPITAL_COMMUNITY): Admit: 2023-01-23 | Payer: Medicare Other | Admitting: Internal Medicine

## 2023-01-23 ENCOUNTER — Encounter (HOSPITAL_COMMUNITY): Payer: Self-pay

## 2023-01-23 SURGERY — CARDIOVERSION (CATH LAB)
Anesthesia: General

## 2023-02-18 ENCOUNTER — Other Ambulatory Visit (HOSPITAL_COMMUNITY): Payer: Self-pay | Admitting: Neurology

## 2023-02-18 MED ORDER — STUDY - LIBREXIA-AF - APIXABAN 5 MG OR PLACEBO CAPSULE (PI-SETHI): 5.0000 mg | ORAL_CAPSULE | Freq: Two times a day (BID) | ORAL | 0 refills | Status: DC

## 2023-02-18 MED ORDER — STUDY - LIBREXIA-AF - JNJ-70033093 (MILVEXIAN) 100 MG OR PLACEBO (PI-SETHI): 1.0000 | ORAL_TABLET | Freq: Two times a day (BID) | ORAL | 0 refills | Status: DC

## 2023-02-19 ENCOUNTER — Other Ambulatory Visit: Payer: Self-pay | Admitting: Cardiology

## 2023-02-19 DIAGNOSIS — I10 Essential (primary) hypertension: Secondary | ICD-10-CM

## 2023-02-19 DIAGNOSIS — I4819 Other persistent atrial fibrillation: Secondary | ICD-10-CM

## 2023-02-24 ENCOUNTER — Encounter: Payer: Self-pay | Admitting: *Deleted

## 2023-02-24 ENCOUNTER — Encounter: Payer: Self-pay | Admitting: Physician Assistant

## 2023-02-24 ENCOUNTER — Ambulatory Visit: Payer: Medicare Other | Attending: Physician Assistant | Admitting: Physician Assistant

## 2023-02-24 VITALS — BP 148/88 | HR 91 | Ht 71.0 in | Wt 216.2 lb

## 2023-02-24 DIAGNOSIS — I4819 Other persistent atrial fibrillation: Secondary | ICD-10-CM | POA: Diagnosis present

## 2023-02-24 DIAGNOSIS — R600 Localized edema: Secondary | ICD-10-CM | POA: Diagnosis present

## 2023-02-24 DIAGNOSIS — I10 Essential (primary) hypertension: Secondary | ICD-10-CM | POA: Diagnosis not present

## 2023-02-24 NOTE — H&P (View-Only) (Signed)
Cardiology Office Note:  .   Date:  02/24/2023  ID:  Steve Murphy, DOB 1950-08-01, MRN [id number] PCP: Lance Bosch, NP  [facility] Providers Cardiologist:  Yates Decamp, MD {   History of Present Illness: .   Steve Murphy is a 73 y.o. male with a past medical history of atrial fibrillation, persistent who presented for evaluation 11/06/2022 ultimately for preoperative cardiovascular clearance.  History includes undergoing echocardiogram 11/06/2022 revealing normal LVEF hence he was cleared for surgery.  He then presented back in December 2024 for 22-month office visit.  He did well with his surgery and did not have any periprocedural complications.  He did endorse dyspnea and fatigue at this time.  At his last office visit in December he was found to be in atrial fibrillation during his preoperative clearance visit and return for follow-up.  Endorse gradually worsening of shortness of breath and decreased endurance over the past year.  Despite the symptoms a stroke risk from atrial fibrillation was deemed low (CHA2DS2-VASc score of 1) and he was not started on blood thinners prior to surgery.  His blood pressure was as high as 188/90 on the day of surgery and 150/90 at home.  At that visit, he was started on anticoagulation and set up for possible cardioversion.  He also started on verapamil for better blood pressure control and encouraged to keep track of his blood pressure at home.  Today, he presents for possible discussion of cardioversion and atrial fibrillation evaluation. The patient, with a history of AFib for eight years (per patient report), presents with weakness and fatigue. 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 The patient also had a stroke in December, after which he was started on apixaban and  a blood pressure medication, verapamil. He reports experiencing low blood pressure and pulse rate as side effects of the verapamil, with blood pressure dropping to 90, though it is usually around 120/80. The patient's blood pressure tends to be higher during doctor visits, which he attributes to "white coat syndrome." He is currently participating in a four-year study involving apixaban.  Reports no shortness of breath nor dyspnea on exertion. Reports no chest pain, pressure, or tightness. No edema, orthopnea, PND. Reports no palpitations.   Discussed the use of AI scribe software for clinical note transcription with the patient, who gave verbal consent to proceed.   ROS: Pertinent ROS in HPI  Studies Reviewed: .        Echocardiogram 10/31/2022:  1. Left ventricular ejection fraction, by estimation, is 50 to 55%. The left ventricle has low normal function. The left ventricle has no regional wall motion abnormalities. There is mild concentric left ventricular hypertrophy. Left ventricular diastolic parameters are indeterminate. 2. Right ventricular systolic function is normal. The right ventricular size is normal. 3. Left atrial size was mildly dilated. 4. Right atrial size was mildly dilated. 5. The mitral valve is normal in structure. Trivial mitral valve regurgitation. No evidence of mitral stenosis. 6. The aortic valve is tricuspid. There is mild calcification of the aortic valve. Aortic valve regurgitation is not visualized. No aortic stenosis is present. 7. Aortic dilatation noted. There is mild dilatation of the ascending aorta, measuring 39 mm. 8. The patient was in atrial fibrillation. 9. The inferior vena cava is normal in size with greater than 50% respiratory  variability, suggesting right atrial pressure of 3 mmHg.   Risk Assessment/Calculations:    CHA2DS2-VASc Score = 2   This indicates a 2.2% annual risk of stroke. The patient's score is based upon: CHF History: 0 HTN  History: 1 Diabetes History: 0 Stroke History: 0 Vascular Disease History: 0 Age Score: 1 Gender Score: 0       Physical Exam:   VS:  BP (!) 148/88   Pulse 91   Ht 5\' 11"  (1.803 m)   Wt 216 lb 3.2 oz (98.1 kg)   SpO2 99%   BMI 30.15 kg/m    Wt Readings from Last 3 Encounters:  02/24/23 216 lb 3.2 oz (98.1 kg)  01/02/23 214 lb 4.6 oz (97.2 kg)  12/31/22 220 lb 6.4 oz (100 kg)    GEN: Well nourished, well developed in no acute distress NECK: No JVD; No carotid bruits CARDIAC: IRIR, no murmurs, rubs, gallops RESPIRATORY:  Clear to auscultation without rales, wheezing or rhonchi  ABDOMEN: Soft, non-tender, non-distended EXTREMITIES:  R LE edema chronic, No deformity   ASSESSMENT AND PLAN: .   Atrial Fibrillation Rate controlled at 78 bpm. Patient reports symptoms of weakness and fatigue. History of stroke in December 2024. Currently on Apixaban 5mg  BID. Discussed the need for anticoagulation prior to cardioversion -D/w Dr. Jacinto Halim, no need for TEE prior to cardioversion. -Schedule cardioversion   Hypertension Patient reports occasional low blood pressure readings at home with Verapamil. Blood pressure was elevated during the visit, likely due to white coat syndrome. -Continue Verapamil as prescribed. -Reevaluate medication if symptoms persist after resolution of atrial fibrillation.  Post-Stroke Management Patient had a stroke in December 2024 and is currently enrolled in a study program with Neurology. -Continue current Neurology follow-up and study program.  Edema Patient reports chronic swelling in one ankle due to a previous hip replacement. -No changes to current management.      Informed Consent   Shared Decision Making/Informed Consent The risks (stroke, cardiac arrhythmias rarely resulting in the need for a temporary or permanent pacemaker, skin irritation or burns and complications associated with conscious sedation including aspiration, arrhythmia, respiratory  failure and death), benefits (restoration of normal sinus rhythm) and alternatives of a direct current cardioversion were explained in detail to Steve Murphy and he agrees to proceed.       Dispo: He can follow-up in a month  Signed, Sharlene Dory, PA-C

## 2023-02-24 NOTE — Progress Notes (Signed)
 Cardiology Office Note:  .   Date:  02/24/2023  ID:  Steve Murphy, DOB 1950-08-01, MRN [id number] PCP: Lance Bosch, NP  [facility] Providers Cardiologist:  Yates Decamp, MD {   History of Present Illness: .   Steve Murphy is a 73 y.o. male with a past medical history of atrial fibrillation, persistent who presented for evaluation 11/06/2022 ultimately for preoperative cardiovascular clearance.  History includes undergoing echocardiogram 11/06/2022 revealing normal LVEF hence he was cleared for surgery.  He then presented back in December 2024 for 22-month office visit.  He did well with his surgery and did not have any periprocedural complications.  He did endorse dyspnea and fatigue at this time.  At his last office visit in December he was found to be in atrial fibrillation during his preoperative clearance visit and return for follow-up.  Endorse gradually worsening of shortness of breath and decreased endurance over the past year.  Despite the symptoms a stroke risk from atrial fibrillation was deemed low (CHA2DS2-VASc score of 1) and he was not started on blood thinners prior to surgery.  His blood pressure was as high as 188/90 on the day of surgery and 150/90 at home.  At that visit, he was started on anticoagulation and set up for possible cardioversion.  He also started on verapamil for better blood pressure control and encouraged to keep track of his blood pressure at home.  Today, he presents for possible discussion of cardioversion and atrial fibrillation evaluation. The patient, with a history of AFib for eight years (per patient report), presents with weakness and fatigue. 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 The patient also had a stroke in December, after which he was started on apixaban and  a blood pressure medication, verapamil. He reports experiencing low blood pressure and pulse rate as side effects of the verapamil, with blood pressure dropping to 90, though it is usually around 120/80. The patient's blood pressure tends to be higher during doctor visits, which he attributes to "white coat syndrome." He is currently participating in a four-year study involving apixaban.  Reports no shortness of breath nor dyspnea on exertion. Reports no chest pain, pressure, or tightness. No edema, orthopnea, PND. Reports no palpitations.   Discussed the use of AI scribe software for clinical note transcription with the patient, who gave verbal consent to proceed.   ROS: Pertinent ROS in HPI  Studies Reviewed: .        Echocardiogram 10/31/2022:  1. Left ventricular ejection fraction, by estimation, is 50 to 55%. The left ventricle has low normal function. The left ventricle has no regional wall motion abnormalities. There is mild concentric left ventricular hypertrophy. Left ventricular diastolic parameters are indeterminate. 2. Right ventricular systolic function is normal. The right ventricular size is normal. 3. Left atrial size was mildly dilated. 4. Right atrial size was mildly dilated. 5. The mitral valve is normal in structure. Trivial mitral valve regurgitation. No evidence of mitral stenosis. 6. The aortic valve is tricuspid. There is mild calcification of the aortic valve. Aortic valve regurgitation is not visualized. No aortic stenosis is present. 7. Aortic dilatation noted. There is mild dilatation of the ascending aorta, measuring 39 mm. 8. The patient was in atrial fibrillation. 9. The inferior vena cava is normal in size with greater than 50% respiratory  variability, suggesting right atrial pressure of 3 mmHg.   Risk Assessment/Calculations:    CHA2DS2-VASc Score = 2   This indicates a 2.2% annual risk of stroke. The patient's score is based upon: CHF History: 0 HTN  History: 1 Diabetes History: 0 Stroke History: 0 Vascular Disease History: 0 Age Score: 1 Gender Score: 0       Physical Exam:   VS:  BP (!) 148/88   Pulse 91   Ht 5\' 11"  (1.803 m)   Wt 216 lb 3.2 oz (98.1 kg)   SpO2 99%   BMI 30.15 kg/m    Wt Readings from Last 3 Encounters:  02/24/23 216 lb 3.2 oz (98.1 kg)  01/02/23 214 lb 4.6 oz (97.2 kg)  12/31/22 220 lb 6.4 oz (100 kg)    GEN: Well nourished, well developed in no acute distress NECK: No JVD; No carotid bruits CARDIAC: IRIR, no murmurs, rubs, gallops RESPIRATORY:  Clear to auscultation without rales, wheezing or rhonchi  ABDOMEN: Soft, non-tender, non-distended EXTREMITIES:  R LE edema chronic, No deformity   ASSESSMENT AND PLAN: .   Atrial Fibrillation Rate controlled at 78 bpm. Patient reports symptoms of weakness and fatigue. History of stroke in December 2024. Currently on Apixaban 5mg  BID. Discussed the need for anticoagulation prior to cardioversion -D/w Dr. Jacinto Halim, no need for TEE prior to cardioversion. -Schedule cardioversion   Hypertension Patient reports occasional low blood pressure readings at home with Verapamil. Blood pressure was elevated during the visit, likely due to white coat syndrome. -Continue Verapamil as prescribed. -Reevaluate medication if symptoms persist after resolution of atrial fibrillation.  Post-Stroke Management Patient had a stroke in December 2024 and is currently enrolled in a study program with Neurology. -Continue current Neurology follow-up and study program.  Edema Patient reports chronic swelling in one ankle due to a previous hip replacement. -No changes to current management.      Informed Consent   Shared Decision Making/Informed Consent The risks (stroke, cardiac arrhythmias rarely resulting in the need for a temporary or permanent pacemaker, skin irritation or burns and complications associated with conscious sedation including aspiration, arrhythmia, respiratory  failure and death), benefits (restoration of normal sinus rhythm) and alternatives of a direct current cardioversion were explained in detail to Steve Murphy and he agrees to proceed.       Dispo: He can follow-up in a month  Signed, Sharlene Dory, PA-C

## 2023-02-24 NOTE — Patient Instructions (Addendum)
 Medication Instructions:   Your physician recommends that you continue on your current medications as directed. Please refer to the Current Medication list given to you today.  *If you need a refill on your cardiac medications before your next appointment, please call your pharmacy*   Lab Work:  PLEASE GO DOWN STAIRS  [facility]  FIRST FLOOR  [address] ( GET OFF ELEVATORS MAKE A LEFT AND ANOTHER LEFT LAB ON RIGHT DOWN HALLWAY : BMET AND CBC TODAY     If you have labs (blood work) drawn today and your tests are completely normal, you will receive your results only by: MyChart Message (if you have MyChart) OR A paper copy in the mail If you have any lab test that is abnormal or we need to change your treatment, we will call you to review the results.   Testing/Procedures: ON 02-27-23  Your physician has recommended that you have a Cardioversion (DCCV). Electrical Cardioversion uses a jolt of electricity to your heart either through paddles or wired patches attached to your chest. This is a controlled, usually prescheduled, procedure. Defibrillation is done under light anesthesia in the hospital, and you usually go home the day of the procedure. This is done to get your heart back into a normal rhythm. You are not awake for the procedure. Please see the instruction sheet given to you today. SEE LETTER ATTACHED.    Follow-Up: At [facility], you and your health needs are our priority.  As part of our continuing mission to provide you with exceptional heart care, we have created designated Provider Care Teams.  These Care Teams include your primary Cardiologist (physician) and Advanced Practice Providers (APPs -  Physician Assistants and Nurse Practitioners) who all work together to provide you with the care you need, when you need it.  We recommend signing up for the patient portal called "MyChart".  Sign up information is provided on this After Visit Summary.  MyChart is used to connect  with patients for Virtual Visits (Telemedicine).  Patients are able to view lab/test results, encounter notes, upcoming appointments, etc.  Non-urgent messages can be sent to your provider as well.   To learn more about what you can do with MyChart, go to [email or web address].     Your next appointment:  AFTER CARDIOVERSION 02-27-23    1 month(s)  Provider:     Knox Perl, MD / [facility]    Other Instructions

## 2023-02-25 LAB — CBC
Hematocrit: 46.9 % (ref 37.5–51.0)
Hemoglobin: 15.2 g/dL (ref 13.0–17.7)
MCH: 30 pg (ref 26.6–33.0)
MCHC: 32.4 g/dL (ref 31.5–35.7)
MCV: 93 fL (ref 79–97)
Platelets: 242 10*3/uL (ref 150–450)
RBC: 5.06 x10E6/uL (ref 4.14–5.80)
RDW: 12.1 % (ref 11.6–15.4)
WBC: 6.4 10*3/uL (ref 3.4–10.8)

## 2023-02-25 LAB — BASIC METABOLIC PANEL
BUN/Creatinine Ratio: 15 (ref 10–24)
BUN: 19 mg/dL (ref 8–27)
CO2: 23 mmol/L (ref 20–29)
Calcium: 9.5 mg/dL (ref 8.6–10.2)
Chloride: 105 mmol/L (ref 96–106)
Creatinine, Ser: 1.29 mg/dL — ABNORMAL HIGH (ref 0.76–1.27)
Glucose: 93 mg/dL (ref 70–99)
Potassium: 5.1 mmol/L (ref 3.5–5.2)
Sodium: 142 mmol/L (ref 134–144)
eGFR: 59 mL/min/{1.73_m2} — ABNORMAL LOW (ref >59)

## 2023-02-26 ENCOUNTER — Encounter: Payer: Self-pay | Admitting: Physician Assistant

## 2023-02-26 NOTE — Progress Notes (Signed)
Pt called for pre procedure instructions. Message left on answering machine. Arrival time 0900 NPO after midnight explained Instructed to take am meds with sip of water and confirmed blood thinner consistency, reminded to take am dose of Eliquis. Instructed pt need for ride home tomorrow and have responsible adult with them for 24 hrs post procedure.

## 2023-02-26 NOTE — Anesthesia Preprocedure Evaluation (Signed)
Anesthesia Evaluation  Patient identified by MRN, date of birth, ID band Patient awake    Reviewed: Allergy & Precautions, NPO status , Patient's Chart, lab work & pertinent test results  Airway Mallampati: II  TM Distance: >3 FB Neck ROM: Full    Dental  (+) Dental Advisory Given, Teeth Intact   Pulmonary neg pulmonary ROS   Pulmonary exam normal breath sounds clear to auscultation       Cardiovascular hypertension, Pt. on medications + dysrhythmias Atrial Fibrillation  Rhythm:Irregular Rate:Normal  Echo 10/20224  1. Left ventricular ejection fraction, by estimation, is 50 to 55%. The left ventricle has low normal function. The left ventricle has no regional wall motion abnormalities. There is mild concentric left ventricular hypertrophy. Left ventricular diastolic parameters are indeterminate.   2. Right ventricular systolic function is normal. The right ventricular size is normal.   3. Left atrial size was mildly dilated.   4. Right atrial size was mildly dilated.   5. The mitral valve is normal in structure. Trivial mitral valve regurgitation. No evidence of mitral stenosis.   6. The aortic valve is tricuspid. There is mild calcification of the aortic valve. Aortic valve regurgitation is not visualized. No aortic stenosis is present.   7. Aortic dilatation noted. There is mild dilatation of the ascending aorta, measuring 39 mm.   8. The patient was in atrial fibrillation.   9. The inferior vena cava is normal in size with greater than 50% respiratory variability, suggesting right atrial pressure of 3 mmHg.      Neuro/Psych CVA    GI/Hepatic negative GI ROS, Neg liver ROS,,,  Endo/Other  negative endocrine ROS    Renal/GU negative Renal ROS     Musculoskeletal  (+) Arthritis ,    Abdominal   Peds  Hematology negative hematology ROS (+)   Anesthesia Other Findings   Reproductive/Obstetrics                              Anesthesia Physical Anesthesia Plan  ASA: 3  Anesthesia Plan: General   Post-op Pain Management: Minimal or no pain anticipated   Induction: Intravenous  PONV Risk Score and Plan: Ondansetron and Dexamethasone  Airway Management Planned: Natural Airway  Additional Equipment:   Intra-op Plan:   Post-operative Plan:   Informed Consent: I have reviewed the patients History and Physical, chart, labs and discussed the procedure including the risks, benefits and alternatives for the proposed anesthesia with the patient or authorized representative who has indicated his/her understanding and acceptance.     Dental advisory given  Plan Discussed with: CRNA  Anesthesia Plan Comments:        Anesthesia Quick Evaluation

## 2023-02-27 ENCOUNTER — Ambulatory Visit (HOSPITAL_COMMUNITY)
Admission: RE | Admit: 2023-02-27 | Discharge: 2023-02-27 | Disposition: A | Discharge status: Home / Self Care | Payer: Medicare Other | Source: Ambulatory Visit | Attending: Cardiovascular Disease | Admitting: Cardiovascular Disease

## 2023-02-27 ENCOUNTER — Ambulatory Visit (HOSPITAL_BASED_OUTPATIENT_CLINIC_OR_DEPARTMENT_OTHER): Payer: Medicare Other | Admitting: Anesthesiology

## 2023-02-27 ENCOUNTER — Encounter (HOSPITAL_COMMUNITY): Payer: Self-pay | Admitting: Cardiovascular Disease

## 2023-02-27 ENCOUNTER — Ambulatory Visit (HOSPITAL_COMMUNITY): Payer: Self-pay | Admitting: Anesthesiology

## 2023-02-27 ENCOUNTER — Other Ambulatory Visit: Payer: Self-pay

## 2023-02-27 ENCOUNTER — Encounter (HOSPITAL_COMMUNITY)
Admission: RE | Disposition: A | Discharge status: Home / Self Care | Payer: Self-pay | Source: Ambulatory Visit | Attending: Cardiovascular Disease

## 2023-02-27 DIAGNOSIS — Z8673 Personal history of transient ischemic attack (TIA), and cerebral infarction without residual deficits: Secondary | ICD-10-CM | POA: Insufficient documentation

## 2023-02-27 DIAGNOSIS — Z96649 Presence of unspecified artificial hip joint: Secondary | ICD-10-CM | POA: Insufficient documentation

## 2023-02-27 DIAGNOSIS — I4891 Unspecified atrial fibrillation: Secondary | ICD-10-CM | POA: Diagnosis not present

## 2023-02-27 DIAGNOSIS — I4819 Other persistent atrial fibrillation: Secondary | ICD-10-CM | POA: Insufficient documentation

## 2023-02-27 DIAGNOSIS — I10 Essential (primary) hypertension: Secondary | ICD-10-CM | POA: Diagnosis not present

## 2023-02-27 DIAGNOSIS — Z7901 Long term (current) use of anticoagulants: Secondary | ICD-10-CM | POA: Insufficient documentation

## 2023-02-27 DIAGNOSIS — M1990 Unspecified osteoarthritis, unspecified site: Secondary | ICD-10-CM | POA: Diagnosis not present

## 2023-02-27 DIAGNOSIS — M25473 Effusion, unspecified ankle: Secondary | ICD-10-CM | POA: Diagnosis not present

## 2023-02-27 HISTORY — PX: CARDIOVERSION: EP1203

## 2023-02-27 SURGERY — CARDIOVERSION (CATH LAB)
Anesthesia: General

## 2023-02-27 MED ORDER — SODIUM CHLORIDE 0.9% FLUSH: 3.0000 mL | Freq: Two times a day (BID) | INTRAVENOUS | Status: DC

## 2023-02-27 MED ORDER — LIDOCAINE 2% (20 MG/ML) 5 ML SYRINGE
INTRAMUSCULAR | Status: DC | PRN
  Administered 2023-02-27: 100 mg via INTRAVENOUS

## 2023-02-27 MED ORDER — PROPOFOL 10 MG/ML IV BOLUS
INTRAVENOUS | Status: DC | PRN
Start: 2023-02-27 — End: 2023-02-27
  Administered 2023-02-27: 50 mg via INTRAVENOUS
  Administered 2023-02-27: 20 mg via INTRAVENOUS

## 2023-02-27 MED ORDER — SODIUM CHLORIDE 0.9% FLUSH: 3.0000 mL | INTRAVENOUS | Status: DC | PRN

## 2023-02-27 SURGICAL SUPPLY — 1 items: PAD DEFIB RADIO PHYSIO CONN (PAD) ×2 IMPLANT

## 2023-02-27 NOTE — Transfer of Care (Signed)
Immediate Anesthesia Transfer of Care Note  Patient: Steve Murphy  Procedure(s) Performed: CARDIOVERSION  Patient Location: Cath Lab  Anesthesia Type:General  Level of Consciousness: awake, alert , oriented, and patient cooperative  Airway & Oxygen Therapy: Patient Spontanous Breathing and Patient connected to nasal cannula oxygen  Post-op Assessment: Report given to RN and Post -op Vital signs reviewed and stable  Post vital signs: Reviewed and stable  Last Vitals:  Vitals Value Taken Time  BP    Temp    Pulse 72 02/27/23 1054  Resp 17 02/27/23 1054  SpO2 93 % 02/27/23 1054  Vitals shown include unfiled device data.  Last Pain:  Vitals:   02/27/23 0823  TempSrc: Temporal  PainSc: 0-No pain         Complications: No notable events documented.

## 2023-02-27 NOTE — CV Procedure (Signed)
Electrical Cardioversion Procedure Note Steve Murphy [id number] [birth year]  Procedure: Electrical Cardioversion Indications:  Atrial Fibrillation  Procedure Details Consent: Risks of procedure as well as the alternatives and risks of each were explained to the (patient/caregiver).  Consent for procedure obtained. Time Out: Verified patient identification, verified procedure, site/side was marked, verified correct patient position, special equipment/implants available, medications/allergies/relevent history reviewed, required imaging and test results available.  Performed  Patient placed on cardiac monitor, pulse oximetry, supplemental oxygen as necessary.  Sedation given:  propofol Pacer pads placed anterior and posterior chest.  Cardioverted 2 time(s).  Cardioverted at 200J unsuccessful.  300J successful.  Evaluation Findings: Post procedure EKG shows: NSR Complications: None Patient did tolerate procedure well.   Chilton Si, MD 02/27/2023, 10:59 AM

## 2023-02-27 NOTE — Anesthesia Postprocedure Evaluation (Signed)
Anesthesia Post Note  Patient: Steve Murphy  Procedure(s) Performed: CARDIOVERSION     Patient location during evaluation: PACU Anesthesia Type: General Level of consciousness: sedated and patient cooperative Pain management: pain level controlled Vital Signs Assessment: post-procedure vital signs reviewed and stable Respiratory status: spontaneous breathing Cardiovascular status: stable Anesthetic complications: no   No notable events documented.  Last Vitals:  Vitals:   02/27/23 0823 02/27/23 1056  BP: (!) 161/84   Pulse: 86   Resp: 15   Temp: (!) 36.4 C 36.7 C  SpO2: 97%     Last Pain:  Vitals:   02/27/23 1056  TempSrc: Temporal  PainSc: 0-No pain                 Lewie Loron

## 2023-02-27 NOTE — Discharge Instructions (Signed)

## 2023-02-27 NOTE — Interval H&P Note (Signed)
History and Physical Interval Note:  02/27/2023 8:20 AM  Steve Murphy  has presented today for surgery, with the diagnosis of AFIB.  The various methods of treatment have been discussed with the patient and family. After consideration of risks, benefits and other options for treatment, the patient has consented to  Procedure(s): CARDIOVERSION (N/A) as a surgical intervention.  The patient's history has been reviewed, patient examined, no change in status, stable for surgery.  I have reviewed the patient's chart and labs.  Questions were answered to the patient's satisfaction.     Chilton Si, MD

## 2023-03-27 ENCOUNTER — Ambulatory Visit: Payer: Medicare Other | Attending: Cardiology | Admitting: Cardiology

## 2023-03-27 ENCOUNTER — Encounter: Payer: Self-pay | Admitting: Cardiology

## 2023-03-27 VITALS — BP 134/78 | HR 67 | Resp 16 | Ht 71.0 in | Wt 215.0 lb

## 2023-03-27 DIAGNOSIS — I4819 Other persistent atrial fibrillation: Secondary | ICD-10-CM | POA: Diagnosis present

## 2023-03-27 DIAGNOSIS — I10 Essential (primary) hypertension: Secondary | ICD-10-CM

## 2023-03-27 DIAGNOSIS — I480 Paroxysmal atrial fibrillation: Secondary | ICD-10-CM

## 2023-03-27 NOTE — Progress Notes (Signed)
 Cardiology Office Note:  .   Date:  03/27/2023  ID:  Steve Murphy, DOB Dec 31, 1950, MRN [id number] PCP: Lance Bosch, NP  [facility] Providers Cardiologist:  Yates Decamp, MD   History of Present Illness: .   Steve Murphy is a 73 y.o.  Caucasian male patient evaluated by me on 11/06/2022 for preoperative cardiovascular risk stratification for upcoming right knee arthroplasty, was found to be in atrial fibrillation which I felt was persistent atrial fibrillation, underwent echocardiogram on 11/06/2022 revealing normal LVEF hence had cleared him for surgery.  Periprocedural complications from surgery, due to persistent fatigue, he underwent direct-current cardioversion on 02/27/2023, and fortunately was successful and converted to sinus rhythm with 300 J of electricity delivered.  He now presents for postprocedure follow-up.  Discussed the use of AI scribe software for clinical note transcription with the patient, who gave verbal consent to proceed.  History of Present Illness   Steve Murphy, a patient with a history of atrial fibrillation (AFib), presents for a follow-up visit after a recent procedure. The patient reports feeling better since the procedure and believes it has helped improve his condition. He has maintained a regular rhythm post-procedure, which is a positive sign. Despite acknowledging the difficulty of weight loss, the patient is committed to making the necessary lifestyle changes to improve his heart health.      Labs   Lab Results  Component Value Date   CHOL 111 01/03/2023   HDL 38 (L) 01/03/2023   LDLCALC 58 01/03/2023   TRIG 74 01/03/2023   CHOLHDL 2.9 01/03/2023   Lab Results  Component Value Date   NA 142 02/24/2023   K 5.1 02/24/2023   CO2 23 02/24/2023   GLUCOSE 93 02/24/2023   BUN 19 02/24/2023   CREATININE 1.29 (H) 02/24/2023   CALCIUM 9.5 02/24/2023   EGFR 59 (L) 02/24/2023   GFRNONAA >60 01/03/2023      Latest Ref Rng & Units  02/24/2023    5:02 PM 01/03/2023    5:56 AM 01/02/2023    2:57 PM  BMP  Glucose 70 - 99 mg/dL 93  92  96   BUN 8 - 27 mg/dL 19  14  17    Creatinine 0.76 - 1.27 mg/dL 3.24  4.01  0.27   BUN/Creat Ratio 10 - 24 15     Sodium 134 - 144 mmol/L 142  141  138   Potassium 3.5 - 5.2 mmol/L 5.1  3.9  3.8   Chloride 96 - 106 mmol/L 105  109  107   CO2 20 - 29 mmol/L 23  25  20    Calcium 8.6 - 10.2 mg/dL 9.5  8.8  9.0       Latest Ref Rng & Units 02/24/2023    5:02 PM 01/03/2023    5:56 AM 01/02/2023    2:57 PM  CBC  WBC 3.4 - 10.8 x10E3/uL 6.4  6.8  6.7   Hemoglobin 13.0 - 17.7 g/dL 25.3  66.4  40.3   Hematocrit 37.5 - 51.0 % 46.9  42.1  45.0   Platelets 150 - 450 x10E3/uL 242  224  257    Lab Results  Component Value Date   HGBA1C 5.0 01/02/2023    External Labs:  Labs 10/25/2022:  TSH normal, T3 and T4 normal.  Review of Systems  Cardiovascular:  Negative for chest pain, dyspnea on exertion and leg swelling.   Physical Exam:   VS:  BP 134/78 (BP Location: Left Arm,  Patient Position: Sitting, Cuff Size: Large)   Pulse 67   Resp 16   Ht 5\' 11"  (1.803 m)   Wt 215 lb (97.5 kg)   SpO2 97%   BMI 29.99 kg/m    Wt Readings from Last 3 Encounters:  03/27/23 215 lb (97.5 kg)  02/27/23 215 lb (97.5 kg)  02/24/23 216 lb 3.2 oz (98.1 kg)    Physical Exam Neck:     Vascular: No carotid bruit or JVD.  Cardiovascular:     Rate and Rhythm: Normal rate and regular rhythm.     Pulses: Intact distal pulses.     Heart sounds: Normal heart sounds. No murmur heard.    No gallop.  Pulmonary:     Effort: Pulmonary effort is normal.     Breath sounds: Normal breath sounds.  Abdominal:     General: Bowel sounds are normal.     Palpations: Abdomen is soft.  Musculoskeletal:     Right lower leg: No edema.     Left lower leg: No edema.    Studies Reviewed: Marland Kitchen    ECHOCARDIOGRAM COMPLETE 10/31/2022  1. Left ventricular ejection fraction, by estimation, is 50 to 55%. The left ventricle  has low normal function. The left ventricle has no regional wall motion abnormalities. There is mild concentric left ventricular hypertrophy. Left ventricular diastolic parameters are indeterminate. 2. Right ventricular systolic function is normal. The right ventricular size is normal. 3. Left atrial size was mildly dilated. 4. Right atrial size was mildly dilated. 5. The mitral valve is normal in structure. Trivial mitral valve regurgitation. No evidence of mitral stenosis. 6. The aortic valve is tricuspid. There is mild calcification of the aortic valve. Aortic valve regurgitation is not visualized. No aortic stenosis is present. 7. Aortic dilatation noted. There is mild dilatation of the ascending aorta, measuring 39 mm. 8. The patient was in atrial fibrillation.  EKG:    EKG Interpretation Date/Time:  Thursday March 27 2023 11:42:01 EDT Ventricular Rate:  65 PR Interval:  190 QRS Duration:  76 QT Interval:  402 QTC Calculation: 418 R Axis:   7  Text Interpretation: EKG 03/27/2023: Normal sinus rhythm with sinus arrhythmia at the rate of 65 bpm otherwise normal EKG.  Compared to 02/24/2023, A-fib with RVR not present and compared to 02/27/2023, single PVC with underlying sinus noted. Confirmed by Delrae Rend [phone]) on 03/27/2023 12:00:12 PM    Medications and allergies    No Known Allergies   Current Outpatient Medications:    acetaminophen (TYLENOL) 500 MG tablet, Take 1,000 mg by mouth every 6 (six) hours as needed (pain.)., Disp: , Rfl:    amoxicillin (AMOXIL) 500 MG tablet, Take 500 mg by mouth daily., Disp: , Rfl:    loratadine (CLARITIN) 5 MG chewable tablet, Chew 5 mg by mouth daily., Disp: , Rfl:    STUDY - LIBREXIA-AF - apixaban 5 mg or placebo capsule (PI-Sethi), Take 1 capsule (5 mg total) by mouth 2 (two) times daily. For Investigational Use Only. Take at approximately the same time of day with or without food. Please bring bottle back with you to every visit; do not  discard bottle. Contact [facility] for questions or concerns about this medication, Disp: 210 capsule, Rfl: 0   STUDY - LIBREXIA-AF - [id number] (Milvexian) 100 mg or placebo tablet (PI-Sethi), Take 1 tablet by mouth 2 (two) times daily. For Investigational Use Only. Take at approximately the same time of day with or without food. Please bring  bottle back with you to every visit; do not discard bottle. Contact [facility] for questions or concerns about this medication, Disp: 210 tablet, Rfl: 0   tamsulosin (FLOMAX) 0.4 MG CAPS capsule, Take 0.4 mg by mouth daily., Disp: , Rfl:    valACYclovir (VALTREX) 1000 MG tablet, Take 1,000 mg by mouth daily. Pt. Takes 1/2 tablet 500 mg once daily., Disp: , Rfl:    verapamil (CALAN-SR) 180 MG CR tablet, TAKE 1 TABLET BY MOUTH AT BEDTIME., Disp: 90 tablet, Rfl: 3   ASSESSMENT AND PLAN: .      ICD-10-CM   1. Paroxysmal atrial fibrillation (HCC)  I48.0 EKG 12-Lead    2. Primary hypertension  I10     3. Persistent atrial fibrillation (HCC)  I48.19       Assessment and Plan    Paroxysmal Atrial Fibrillation (PAF)   He underwent successful cardioversion with 300 Joules, achieving normal sinus rhythm. Reports improved activity levels. EKG shows no ischemia. Emphasized weight loss to maintain rhythm and prevent recurrence. Schedule follow-up in 3 months with repeat EKG to monitor for atrial fibrillation recurrence. Encourage weight loss of 10 pounds to maintain regular heart rhythm.  Hypertension   Hypertension is a comorbid condition. Blood pressure management is crucial to reduce cardiovascular risk in the context of atrial fibrillation.          Signed,  Yates Decamp, MD, [facility] 03/27/2023, 9:43 PM [facility] [address] [place], [place] [address] Phone: [phone]. Fax:  [phone]

## 2023-03-27 NOTE — Patient Instructions (Addendum)
 Medication Instructions:  Your physician recommends that you continue on your current medications as directed. Please refer to the Current Medication list given to you today.  *If you need a refill on your cardiac medications before your next appointment, please call your pharmacy*   Lab Work: none If you have labs (blood work) drawn today and your tests are completely normal, you will receive your results only by: MyChart Message (if you have MyChart) OR A paper copy in the mail If you have any lab test that is abnormal or we need to change your treatment, we will call you to review the results.   Testing/Procedures: none   Follow-Up: At [facility], you and your health needs are our priority.  As part of our continuing mission to provide you with exceptional heart care, we have created designated Provider Care Teams.  These Care Teams include your primary Cardiologist (physician) and Advanced Practice Providers (APPs -  Physician Assistants and Nurse Practitioners) who all work together to provide you with the care you need, when you need it.  We recommend signing up for the patient portal called "MyChart".  Sign up information is provided on this After Visit Summary.  MyChart is used to connect with patients for Virtual Visits (Telemedicine).  Patients are able to view lab/test results, encounter notes, upcoming appointments, etc.  Non-urgent messages can be sent to your provider as well.   To learn more about what you can do with MyChart, go to [email or web address].    Your next appointment:    June 13 Provider:   Yates Decamp, MD     Other Instructions

## 2023-04-14 ENCOUNTER — Telehealth: Payer: Self-pay

## 2023-04-14 NOTE — Patient Outreach (Signed)
 First telephone outreach attempt to obtain mRS. No answer. Left message for returned call.  [facility]  Population Health Care Management Assistant  Direct Dial: [phone]  Fax: [phone] Website: [email or web address]

## 2023-04-17 ENCOUNTER — Telehealth: Payer: Self-pay

## 2023-04-17 NOTE — Patient Outreach (Signed)
 Telephone outreach to patient's daughter to obtain mRS was successfully completed. MRS= 1  Vanice [facility] Health Care Management Assistant  Direct Dial: [phone]  Fax: [phone] Website: [email or web address]

## 2023-04-21 ENCOUNTER — Other Ambulatory Visit (HOSPITAL_COMMUNITY): Payer: Self-pay | Admitting: Neurology

## 2023-04-21 MED ORDER — STUDY - LIBREXIA-AF - APIXABAN 5 MG OR PLACEBO CAPSULE (PI-SETHI): 5.0000 mg | ORAL_CAPSULE | Freq: Two times a day (BID) | ORAL | 0 refills | Status: DC

## 2023-04-21 MED ORDER — STUDY - LIBREXIA-AF - JNJ-70033093 (MILVEXIAN) 100 MG OR PLACEBO (PI-SETHI): 1.0000 | ORAL_TABLET | Freq: Two times a day (BID) | ORAL | 0 refills | Status: DC

## 2023-06-27 ENCOUNTER — Encounter: Payer: Self-pay | Admitting: Cardiology

## 2023-06-27 ENCOUNTER — Other Ambulatory Visit (HOSPITAL_COMMUNITY): Payer: Self-pay

## 2023-06-27 ENCOUNTER — Ambulatory Visit: Attending: Cardiology | Admitting: Cardiology

## 2023-06-27 ENCOUNTER — Other Ambulatory Visit: Payer: Self-pay | Admitting: *Deleted

## 2023-06-27 VITALS — BP 143/87 | HR 76 | Resp 16 | Ht 71.0 in | Wt 216.8 lb

## 2023-06-27 DIAGNOSIS — I10 Essential (primary) hypertension: Secondary | ICD-10-CM | POA: Insufficient documentation

## 2023-06-27 DIAGNOSIS — I480 Paroxysmal atrial fibrillation: Secondary | ICD-10-CM | POA: Diagnosis present

## 2023-06-27 DIAGNOSIS — Z01812 Encounter for preprocedural laboratory examination: Secondary | ICD-10-CM | POA: Diagnosis present

## 2023-06-27 DIAGNOSIS — Z5181 Encounter for therapeutic drug level monitoring: Secondary | ICD-10-CM | POA: Insufficient documentation

## 2023-06-27 MED ORDER — FLECAINIDE ACETATE 100 MG PO TABS
100.0000 mg | ORAL_TABLET | Freq: Two times a day (BID) | ORAL | 2 refills | Status: DC
  Filled 2023-06-27: qty 60, 30d supply, fill #0
  Filled 2023-07-24 – 2023-07-25 (×2): qty 60, 30d supply, fill #1

## 2023-06-27 MED ORDER — METOPROLOL TARTRATE 25 MG PO TABS
25.0000 mg | ORAL_TABLET | Freq: Two times a day (BID) | ORAL | 2 refills | Status: DC
  Filled 2023-06-27: qty 60, 30d supply, fill #0
  Filled 2023-07-24 – 2023-07-25 (×2): qty 60, 30d supply, fill #1
  Filled 2023-08-22: qty 60, 30d supply, fill #2

## 2023-06-27 MED ORDER — LOSARTAN POTASSIUM 25 MG PO TABS
25.0000 mg | ORAL_TABLET | Freq: Every evening | ORAL | 2 refills | Status: DC
  Filled 2023-06-27: qty 30, 30d supply, fill #0
  Filled 2023-07-24 – 2023-07-25 (×2): qty 30, 30d supply, fill #1
  Filled 2023-08-22: qty 30, 30d supply, fill #2

## 2023-06-27 NOTE — H&P (View-Only) (Signed)
 Cardiology Office Note:  .   Date:  06/27/2023  ID:  Steve Murphy, DOB [birth year], MRN [id number] PCP: Armida Berry, NP  [facility] Providers Cardiologist:  Knox Perl, MD   History of Present Illness: .   Steve Murphy is a 73 y.o. Caucasian male patient with paroxysmal atrial fibrillation new onset on 11/06/2022, underwent direct-current cardioversion on 02/27/2023 to sinus rhythm.  He presents for a 28-month office visit.  Echocardiogram on 11/06/2022 revealed normal LVEF.  His past medical history is significant for hypertension.  He is enrolled in [place] study with regard to anticoagulation ([id number] (Milvexian) 100 mg or placebo tablet (PI-Sethi), Take 1 tablet by mouth 2 (two) times daily).  Discussed the use of AI scribe software for clinical note transcription with the patient, who gave verbal consent to proceed.  History of Present Illness   Steve Murphy is a 73 year old male with atrial fibrillation who presents with feeling like he is slowing down again.  He associates the sensation of slowing down with his atrial fibrillation, similar to past experiences. Despite anticoagulation and rate control medication, he notices a significant decrease in activity level. He denies sleep apnea and has not undergone a sleep study. He sleeps well without significant daytime sleepiness or disturbances.      Labs   Lab Results  Component Value Date   CHOL 111 01/03/2023   HDL 38 (L) 01/03/2023   LDLCALC 58 01/03/2023   TRIG 74 01/03/2023   CHOLHDL 2.9 01/03/2023   Lab Results  Component Value Date   NA 142 02/24/2023   K 5.1 02/24/2023   CO2 23 02/24/2023   GLUCOSE 93 02/24/2023   BUN 19 02/24/2023   CREATININE 1.29 (H) 02/24/2023   CALCIUM 9.5 02/24/2023   EGFR 59 (L) 02/24/2023   GFRNONAA >60 01/03/2023      Latest Ref Rng & Units 02/24/2023    5:02 PM 01/03/2023    5:56 AM 01/02/2023    2:57 PM  BMP  Glucose 70 - 99 mg/dL 93  92  96   BUN 8 -  27 mg/dL 19  14  17    Creatinine 0.76 - 1.27 mg/dL 2.95  6.21  3.08   BUN/Creat Ratio 10 - 24 15     Sodium 134 - 144 mmol/L 142  141  138   Potassium 3.5 - 5.2 mmol/L 5.1  3.9  3.8   Chloride 96 - 106 mmol/L 105  109  107   CO2 20 - 29 mmol/L 23  25  20    Calcium 8.6 - 10.2 mg/dL 9.5  8.8  9.0       Latest Ref Rng & Units 02/24/2023    5:02 PM 01/03/2023    5:56 AM 01/02/2023    2:57 PM  CBC  WBC 3.4 - 10.8 x10E3/uL 6.4  6.8  6.7   Hemoglobin 13.0 - 17.7 g/dL 65.7  84.6  96.2   Hematocrit 37.5 - 51.0 % 46.9  42.1  45.0   Platelets 150 - 450 x10E3/uL 242  224  257    Lab Results  Component Value Date   HGBA1C 5.0 01/02/2023    External Labs:  Labs 10/25/2022:  TSH normal, T3 and T4 normal.  ROS  Review of Systems  Constitutional: Positive for malaise/fatigue.  Cardiovascular:  Negative for chest pain, dyspnea on exertion and leg swelling.  All other systems reviewed and are negative.   Physical Exam:   VS:  BP Aaron Aas)  143/87 (BP Location: Left Arm, Patient Position: Sitting, Cuff Size: Normal)   Pulse 76   Resp 16   Ht 5' 11 (1.803 m)   Wt 216 lb 12.8 oz (98.3 kg)   SpO2 98%   BMI 30.24 kg/m    Wt Readings from Last 3 Encounters:  06/27/23 216 lb 12.8 oz (98.3 kg)  03/27/23 215 lb (97.5 kg)  02/27/23 215 lb (97.5 kg)    Physical Exam Neck:     Vascular: No carotid bruit or JVD.   Cardiovascular:     Rate and Rhythm: Normal rate. Rhythm irregular.     Pulses: Normal pulses and intact distal pulses.     Heart sounds: No murmur heard. Pulmonary:     Effort: Pulmonary effort is normal.     Breath sounds: Normal breath sounds.  Abdominal:     General: Bowel sounds are normal.     Palpations: Abdomen is soft.   Musculoskeletal:     Right lower leg: No edema.     Left lower leg: No edema.   Skin:    Capillary Refill: Capillary refill takes less than 2 seconds.    Studies Reviewed: .    Echo 10/31/2022: Normal LVEF at 50 to 55%. EKG:    EKG  Interpretation Date/Time:  Friday June 27 2023 10:37:40 EDT Ventricular Rate:  81 PR Interval:    QRS Duration:  74 QT Interval:  368 QTC Calculation: 427 R Axis:   6  Text Interpretation: EKG 06/27/2023: Atrial fibrillation with controlled ventricular response at the rate of 81 bpm.  Compared to 03/27/2023, sinus rhythm has been replaced. No change from 02/24/2023. Confirmed by Maikayla Beggs, Jagadeesh ([id number]) on 06/27/2023 10:49:33 AM    Medications ordered    Meds ordered this encounter  Medications   flecainide (TAMBOCOR) 100 MG tablet    Sig: Take 1 tablet (100 mg total) by mouth 2 (two) times daily.    Dispense:  60 tablet    Refill:  2   metoprolol tartrate (LOPRESSOR) 25 MG tablet    Sig: Take 1 tablet (25 mg total) by mouth 2 (two) times daily.    Dispense:  60 tablet    Refill:  2    Discontinue Verapamil    losartan (COZAAR) 25 MG tablet    Sig: Take 1 tablet (25 mg total) by mouth every evening.    Dispense:  30 tablet    Refill:  2     ASSESSMENT AND PLAN: .      ICD-10-CM   1. Paroxysmal atrial fibrillation (HCC)  I48.0 EKG 12-Lead    Ambulatory referral to Cardiac Electrophysiology    flecainide (TAMBOCOR) 100 MG tablet    metoprolol tartrate (LOPRESSOR) 25 MG tablet    Split night study    EXERCISE TOLERANCE TEST (ETT)    CBC    Basic metabolic panel with GFR    Cardiac Stress Test: Informed Consent Details: Physician/Practitioner Attestation; Transcribe to consent form and obtain patient signature    2. Primary hypertension  I10 losartan (COZAAR) 25 MG tablet    Split night study    EXERCISE TOLERANCE TEST (ETT)    3. Pre-procedure lab exam  Z01.812 CBC    Basic metabolic panel with GFR    4. Therapeutic drug monitoring  Z51.81 Cardiac Stress Test: Informed Consent Details: Physician/Practitioner Attestation; Transcribe to consent form and obtain patient signature     1. Paroxysmal atrial fibrillation (HCC) (Primary) Patient is back in A-fib.  My suspicion  for coronary artery disease is very low, except for very mild hypertension, he has no other cardiovascular factors except his age.  Lipids are normal, in spite of A-fib, normal EKG, no vascular examination, non-smoker and nondiabetic.  Hence I feel comfortable in starting him and initiating him on flecainide 100 mg p.o. twice daily and we will schedule him for a treadmill exercise stress test for therapeutic drug monitoring.  Please see below orders for other monitoring and recommendations, will discontinue verapamil  due to interaction with flecainide and switching to metoprolol tartrate and add losartan for hypertension control.  - EKG 12-Lead - Ambulatory referral to Cardiac Electrophysiology - flecainide (TAMBOCOR) 100 MG tablet; Take 1 tablet (100 mg total) by mouth 2 (two) times daily.  Dispense: 60 tablet; Refill: 2 - metoprolol tartrate (LOPRESSOR) 25 MG tablet; Take 1 tablet (25 mg total) by mouth 2 (two) times daily.  Dispense: 60 tablet; Refill: 2 - Split night study; Future - EXERCISE TOLERANCE TEST (ETT); Future - CBC; Future - Basic metabolic panel with GFR; Future - Scheduled for direct-current cardioversion.  Questions concerns complications extensively discussed as previously with patient and his wife at the bedside. -  2. Primary hypertension  - losartan (COZAAR) 25 MG tablet; Take 1 tablet (25 mg total) by mouth every evening.  Dispense: 30 tablet; Refill: 2 - Split night study; Future - EXERCISE TOLERANCE TEST (ETT); Future  3. Pre-procedure lab exam  - CBC; Future - Basic metabolic panel with GFR; Future -Office visit in 3 months.   Signed,  Knox Perl, MD, [facility] 06/27/2023, 5:11 PM [facility] [address] [place], [place] [address] Phone: [phone]. Fax:  [phone]

## 2023-06-27 NOTE — Patient Instructions (Signed)
 Medication Instructions:  Your physician has recommended you make the following change in your medication: Stop verapamil  Start flecainide 100 mg by mouth twice daily Start metoprolol tartrate 25 mg by mouth twice daily  Start losartan 25 mg by mouth every evening  *If you need a refill on your cardiac medications before your next appointment, please call your pharmacy*  Lab Work: Please have blood work at your closest [facility] July 2 or 3 (BMP,CBC)  If you have labs (blood work) drawn today and your tests are completely normal, you will receive your results only by: [employer] (if you have MyChart) OR A paper copy in the mail If you have any lab test that is abnormal or we need to change your treatment, we will call you to review the results.  Testing/Procedures: You have been referred to see an electrophysiologist in our office.     Your physician has requested that you have an exercise tolerance test in 2 weeks.. For further information please visit [email or web address] Please also follow instruction sheet, as given.  Your physician has recommended that you have a sleep study. This test records several body functions during sleep, including: brain activity, eye movement, oxygen and carbon dioxide blood levels, heart rate and rhythm, breathing rate and rhythm, the flow of air through your mouth and nose, snoring, body muscle movements, and chest and belly movement.   Your physician has recommended that you have a Cardioversion (DCCV). Electrical Cardioversion uses a jolt of electricity to your heart either through paddles or wired patches attached to your chest. This is a controlled, usually prescheduled, procedure. Defibrillation is done under light anesthesia in the hospital, and you usually go home the day of the procedure. This is done to get your heart back into a normal rhythm. You are not awake for the procedure. Please see the instruction sheet given to you today.        Dear Steve Murphy  You are scheduled for a Cardioversion on Monday, July 7 with Dr. Maximo Spar.  Please arrive at the [facility] (Main Entrance A) at [facility]: [address] [place], [place] [address] at 7:00 AM (This time is 1 hour(s) before your procedure to ensure your preparation).   Free valet parking service is available. You will check in at ADMITTING.   *Please Note: You will receive a call the day before your procedure to confirm the appointment time. That time may have changed from the original time based on the schedule for that day.*    DIET:  Nothing to eat or drink after midnight except a sip of water with medications (see medication instructions below)  MEDICATION INSTRUCTIONS: !!IF ANY NEW MEDICATIONS ARE STARTED AFTER TODAY, PLEASE NOTIFY YOUR PROVIDER AS SOON AS POSSIBLE!!  FYI: Medications such as Semaglutide (Ozempic, [place]), Tirzepatide (Mounjaro, Zepbound), Dulaglutide (Trulicity), etc (GLP1 agonists) AND Canagliflozin (Invokana), Dapagliflozin (Farxiga), Empagliflozin (Jardiance), Ertugliflozin (Steglatro), Bexagliflozin [employer]) or any combination with one of these drugs such as Invokamet (Canagliflozin/Metformin), Synjardy (Empagliflozin/Metformin), etc (SGLT2 inhibitors) must be held around the time of a procedure. This is not a comprehensive list of all of these drugs. Please review all of your medications and talk to your provider if you take any one of these. If you are not sure, ask your provider.     Continue taking your anticoagulant (blood thinner): Librexia.  You will need to continue this after your procedure until you are told by your provider that it is safe to stop.  LABS: please have blood work July 2 or 3 at your closest [facility].     [place]:  For your safety, and to allow us  to monitor your vital signs accurately during the surgery/procedure we request: If you have artificial nails, gel coating, SNS etc, please have those removed  prior to your surgery/procedure. Not having the nail coverings /polish removed may result in cancellation or delay of your surgery/procedure.  Your support person will be asked to wait in the waiting room during your procedure.  It is OK to have someone drop you off and come back when you are ready to be discharged.  You cannot drive after the procedure and will need someone to drive you home.  Bring your insurance cards.  *Special Note: Every effort is made to have your procedure done on time. Occasionally there are emergencies that occur at the hospital that may cause delays. Please be patient if a delay does occur.    How to prepare for your Exercise Stress Test: - Do bring a list of your Murphy medications with you. If you do not take any of the medications listed below, you may take your medications as normal the day of the test. Take all your normal medications - DO wear comfortable clothes (no dresses or overalls) and walking shoes, tennis shoes preferred (no heels or open toed shoes allowed).   Follow-Up: At [facility], you and your health needs are our priority.  As part of our continuing mission to provide you with exceptional heart care, our providers are all part of one team.  This team includes your primary Cardiologist (physician) and Advanced Practice Providers or APPs (Physician Assistants and Nurse Practitioners) who all work together to provide you with the care you need, when you need it.  Your next appointment:   3 month(s)  Provider:   Knox Perl, MD    We recommend signing up for the patient portal called MyChart.  Sign up information is provided on this After Visit Summary.  MyChart is used to connect with patients for Virtual Visits (Telemedicine).  Patients are able to view lab/test results, encounter notes, upcoming appointments, etc.  Non-urgent messages can be sent to your provider as well.   To learn more about what you can do with MyChart, go to  [email or web address].

## 2023-06-27 NOTE — Progress Notes (Signed)
 Cardiology Office Note:  .   Date:  06/27/2023  ID:  Steve Murphy, DOB [birth year], MRN [id number] PCP: Armida Berry, NP  [facility] Providers Cardiologist:  Knox Perl, MD   History of Present Illness: .   Steve Murphy is a 73 y.o. Caucasian male patient with paroxysmal atrial fibrillation new onset on 11/06/2022, underwent direct-current cardioversion on 02/27/2023 to sinus rhythm.  He presents for a 28-month office visit.  Echocardiogram on 11/06/2022 revealed normal LVEF.  His past medical history is significant for hypertension.  He is enrolled in [place] study with regard to anticoagulation ([id number] (Milvexian) 100 mg or placebo tablet (PI-Sethi), Take 1 tablet by mouth 2 (two) times daily).  Discussed the use of AI scribe software for clinical note transcription with the patient, who gave verbal consent to proceed.  History of Present Illness   Steve Murphy is a 73 year old male with atrial fibrillation who presents with feeling like he is slowing down again.  He associates the sensation of slowing down with his atrial fibrillation, similar to past experiences. Despite anticoagulation and rate control medication, he notices a significant decrease in activity level. He denies sleep apnea and has not undergone a sleep study. He sleeps well without significant daytime sleepiness or disturbances.      Labs   Lab Results  Component Value Date   CHOL 111 01/03/2023   HDL 38 (L) 01/03/2023   LDLCALC 58 01/03/2023   TRIG 74 01/03/2023   CHOLHDL 2.9 01/03/2023   Lab Results  Component Value Date   NA 142 02/24/2023   K 5.1 02/24/2023   CO2 23 02/24/2023   GLUCOSE 93 02/24/2023   BUN 19 02/24/2023   CREATININE 1.29 (H) 02/24/2023   CALCIUM 9.5 02/24/2023   EGFR 59 (L) 02/24/2023   GFRNONAA >60 01/03/2023      Latest Ref Rng & Units 02/24/2023    5:02 PM 01/03/2023    5:56 AM 01/02/2023    2:57 PM  BMP  Glucose 70 - 99 mg/dL 93  92  96   BUN 8 -  27 mg/dL 19  14  17    Creatinine 0.76 - 1.27 mg/dL 2.95  6.21  3.08   BUN/Creat Ratio 10 - 24 15     Sodium 134 - 144 mmol/L 142  141  138   Potassium 3.5 - 5.2 mmol/L 5.1  3.9  3.8   Chloride 96 - 106 mmol/L 105  109  107   CO2 20 - 29 mmol/L 23  25  20    Calcium 8.6 - 10.2 mg/dL 9.5  8.8  9.0       Latest Ref Rng & Units 02/24/2023    5:02 PM 01/03/2023    5:56 AM 01/02/2023    2:57 PM  CBC  WBC 3.4 - 10.8 x10E3/uL 6.4  6.8  6.7   Hemoglobin 13.0 - 17.7 g/dL 65.7  84.6  96.2   Hematocrit 37.5 - 51.0 % 46.9  42.1  45.0   Platelets 150 - 450 x10E3/uL 242  224  257    Lab Results  Component Value Date   HGBA1C 5.0 01/02/2023    External Labs:  Labs 10/25/2022:  TSH normal, T3 and T4 normal.  ROS  Review of Systems  Constitutional: Positive for malaise/fatigue.  Cardiovascular:  Negative for chest pain, dyspnea on exertion and leg swelling.  All other systems reviewed and are negative.   Physical Exam:   VS:  BP Aaron Aas)  143/87 (BP Location: Left Arm, Patient Position: Sitting, Cuff Size: Normal)   Pulse 76   Resp 16   Ht 5' 11 (1.803 m)   Wt 216 lb 12.8 oz (98.3 kg)   SpO2 98%   BMI 30.24 kg/m    Wt Readings from Last 3 Encounters:  06/27/23 216 lb 12.8 oz (98.3 kg)  03/27/23 215 lb (97.5 kg)  02/27/23 215 lb (97.5 kg)    Physical Exam Neck:     Vascular: No carotid bruit or JVD.   Cardiovascular:     Rate and Rhythm: Normal rate. Rhythm irregular.     Pulses: Normal pulses and intact distal pulses.     Heart sounds: No murmur heard. Pulmonary:     Effort: Pulmonary effort is normal.     Breath sounds: Normal breath sounds.  Abdominal:     General: Bowel sounds are normal.     Palpations: Abdomen is soft.   Musculoskeletal:     Right lower leg: No edema.     Left lower leg: No edema.   Skin:    Capillary Refill: Capillary refill takes less than 2 seconds.    Studies Reviewed: .    Echo 10/31/2022: Normal LVEF at 50 to 55%. EKG:    EKG  Interpretation Date/Time:  Friday June 27 2023 10:37:40 EDT Ventricular Rate:  81 PR Interval:    QRS Duration:  74 QT Interval:  368 QTC Calculation: 427 R Axis:   6  Text Interpretation: EKG 06/27/2023: Atrial fibrillation with controlled ventricular response at the rate of 81 bpm.  Compared to 03/27/2023, sinus rhythm has been replaced. No change from 02/24/2023. Confirmed by Maikayla Beggs, Jagadeesh ([id number]) on 06/27/2023 10:49:33 AM    Medications ordered    Meds ordered this encounter  Medications   flecainide (TAMBOCOR) 100 MG tablet    Sig: Take 1 tablet (100 mg total) by mouth 2 (two) times daily.    Dispense:  60 tablet    Refill:  2   metoprolol tartrate (LOPRESSOR) 25 MG tablet    Sig: Take 1 tablet (25 mg total) by mouth 2 (two) times daily.    Dispense:  60 tablet    Refill:  2    Discontinue Verapamil    losartan (COZAAR) 25 MG tablet    Sig: Take 1 tablet (25 mg total) by mouth every evening.    Dispense:  30 tablet    Refill:  2     ASSESSMENT AND PLAN: .      ICD-10-CM   1. Paroxysmal atrial fibrillation (HCC)  I48.0 EKG 12-Lead    Ambulatory referral to Cardiac Electrophysiology    flecainide (TAMBOCOR) 100 MG tablet    metoprolol tartrate (LOPRESSOR) 25 MG tablet    Split night study    EXERCISE TOLERANCE TEST (ETT)    CBC    Basic metabolic panel with GFR    Cardiac Stress Test: Informed Consent Details: Physician/Practitioner Attestation; Transcribe to consent form and obtain patient signature    2. Primary hypertension  I10 losartan (COZAAR) 25 MG tablet    Split night study    EXERCISE TOLERANCE TEST (ETT)    3. Pre-procedure lab exam  Z01.812 CBC    Basic metabolic panel with GFR    4. Therapeutic drug monitoring  Z51.81 Cardiac Stress Test: Informed Consent Details: Physician/Practitioner Attestation; Transcribe to consent form and obtain patient signature     1. Paroxysmal atrial fibrillation (HCC) (Primary) Patient is back in A-fib.  My suspicion  for coronary artery disease is very low, except for very mild hypertension, he has no other cardiovascular factors except his age.  Lipids are normal, in spite of A-fib, normal EKG, no vascular examination, non-smoker and nondiabetic.  Hence I feel comfortable in starting him and initiating him on flecainide 100 mg p.o. twice daily and we will schedule him for a treadmill exercise stress test for therapeutic drug monitoring.  Please see below orders for other monitoring and recommendations, will discontinue verapamil  due to interaction with flecainide and switching to metoprolol tartrate and add losartan for hypertension control.  - EKG 12-Lead - Ambulatory referral to Cardiac Electrophysiology - flecainide (TAMBOCOR) 100 MG tablet; Take 1 tablet (100 mg total) by mouth 2 (two) times daily.  Dispense: 60 tablet; Refill: 2 - metoprolol tartrate (LOPRESSOR) 25 MG tablet; Take 1 tablet (25 mg total) by mouth 2 (two) times daily.  Dispense: 60 tablet; Refill: 2 - Split night study; Future - EXERCISE TOLERANCE TEST (ETT); Future - CBC; Future - Basic metabolic panel with GFR; Future - Scheduled for direct-current cardioversion.  Questions concerns complications extensively discussed as previously with patient and his wife at the bedside. -  2. Primary hypertension  - losartan (COZAAR) 25 MG tablet; Take 1 tablet (25 mg total) by mouth every evening.  Dispense: 30 tablet; Refill: 2 - Split night study; Future - EXERCISE TOLERANCE TEST (ETT); Future  3. Pre-procedure lab exam  - CBC; Future - Basic metabolic panel with GFR; Future -Office visit in 3 months.   Signed,  Knox Perl, MD, [facility] 06/27/2023, 5:11 PM [facility] [address] [place], [place] [address] Phone: [phone]. Fax:  [phone]

## 2023-06-30 ENCOUNTER — Telehealth: Payer: Self-pay | Admitting: *Deleted

## 2023-06-30 NOTE — Telephone Encounter (Signed)
-----   Message from Nurse Devra Fontana A sent at 06/27/2023 11:48 AM EDT ----- Regarding: sleep study Dr Berry [place] has ordered a sleep study for this patient

## 2023-06-30 NOTE — Telephone Encounter (Signed)
 Prior Authorization for [facility] [place] [facility] sent to MEDICARE via Phone. Reference # . NO AUTH REQ.

## 2023-07-08 ENCOUNTER — Encounter (HOSPITAL_COMMUNITY): Payer: Self-pay | Admitting: *Deleted

## 2023-07-15 ENCOUNTER — Other Ambulatory Visit (HOSPITAL_COMMUNITY): Payer: Self-pay | Admitting: Pharmacist

## 2023-07-15 MED ORDER — STUDY - LIBREXIA-AF - APIXABAN 5 MG OR PLACEBO CAPSULE (PI-SETHI): 5.0000 mg | ORAL_CAPSULE | Freq: Two times a day (BID) | ORAL | 0 refills | Status: DC

## 2023-07-15 MED ORDER — STUDY - LIBREXIA-AF - JNJ-70033093 (MILVEXIAN) 100 MG OR PLACEBO (PI-SETHI): 1.0000 | ORAL_TABLET | Freq: Two times a day (BID) | ORAL | 0 refills | Status: DC

## 2023-07-16 ENCOUNTER — Telehealth: Payer: Self-pay | Admitting: *Deleted

## 2023-07-16 ENCOUNTER — Ambulatory Visit (HOSPITAL_COMMUNITY)
Admission: RE | Admit: 2023-07-16 | Discharge: 2023-07-16 | Disposition: A | Discharge status: Home / Self Care | Source: Ambulatory Visit | Attending: Cardiology | Admitting: Cardiology

## 2023-07-16 DIAGNOSIS — I10 Essential (primary) hypertension: Secondary | ICD-10-CM | POA: Diagnosis not present

## 2023-07-16 DIAGNOSIS — I480 Paroxysmal atrial fibrillation: Secondary | ICD-10-CM | POA: Diagnosis present

## 2023-07-16 LAB — CBC
Hematocrit: 45.2 % (ref 37.5–51.0)
Hemoglobin: 15 g/dL (ref 13.0–17.7)
MCH: 32.6 pg (ref 26.6–33.0)
MCHC: 33.2 g/dL (ref 31.5–35.7)
MCV: 98 fL — ABNORMAL HIGH (ref 79–97)
Platelets: 219 10*3/uL (ref 150–450)
RBC: 4.6 x10E6/uL (ref 4.14–5.80)
RDW: 11.7 % (ref 11.6–15.4)
WBC: 6.7 10*3/uL (ref 3.4–10.8)

## 2023-07-16 MED ORDER — APIXABAN 5 MG PO TABS: 5.0000 mg | ORAL_TABLET | Freq: Two times a day (BID) | ORAL | 0 refills | Status: DC

## 2023-07-16 MED ORDER — STUDY - LIBREXIA-AF - JNJ-70033093 (MILVEXIAN) 100 MG OR PLACEBO (PI-SETHI): 1.0000 | ORAL_TABLET | Freq: Two times a day (BID) | ORAL | 0 refills | Status: DC

## 2023-07-16 MED ORDER — STUDY - LIBREXIA-AF - APIXABAN 5 MG OR PLACEBO CAPSULE (PI-SETHI): 5.0000 mg | ORAL_CAPSULE | Freq: Two times a day (BID) | ORAL | 0 refills | Status: DC

## 2023-07-16 NOTE — Telephone Encounter (Signed)
 Ahmed, Jamil  Ganji, Jay, MD; Rosemarie Eather RAMAN, MD Good Afternoon Dr. Ladona: Mr. Ludtke will be having cardioversion on 07/21/2023.  He is on the [place] Afib trial with Milvexian.  Subject is here at [facility] for follow up, asking when to stop and restart IP as well as prescription for Eliquis  for the pre and post procedure period. Please advise. Thank you - Appreciate your time. Jamil Ahmed Clinical Research Manager [phone]  Tue 7:11 PM PS Eather RAMAN Rosemarie, MD ok to hold IP in periprocedural period for cardioversion and switch to eliquis  and switch back to IP after cardioversion as per Dr Ladona. He can prescribe eliquis  for this short period  Gordy Ladona, MD Please Rx Eliquis  for 30 days and use discount card if applicable and 2 refills to local pharmacy. Depending upon his maintenance of sinus, I may stop eliquis  after 4 weeks post cardioversion. DCCV to be done 3 weeks post eliquis  initiation   Above is from chat message

## 2023-07-16 NOTE — Telephone Encounter (Signed)
 Left message to call office

## 2023-07-16 NOTE — Telephone Encounter (Signed)
 Spoke to Dr. Rosemarie. Just two days before stop study med and start eliquis  total for a month, then patient goes back on study medication. So patient can be scheduled for cardioversion anytime.

## 2023-07-16 NOTE — Telephone Encounter (Signed)
 Prescription not sent to [facility].  Can be sent at follow up office visit if needed.  Patient aware.  He will pick up samples today

## 2023-07-16 NOTE — Telephone Encounter (Signed)
 Eliquis  5 mg by mouth twice daily for 28 days samples, is ready for a patient to pick on 5th floor, front desk, coumadin clinic.

## 2023-07-16 NOTE — Telephone Encounter (Signed)
 I spoke with patient and gave him instructions below regarding change to Eliquis .  Patient aware Eliquis  is 5 mg twice daily.  Patient does not know if he previously used Eliquis  free 30 day card.  Will leave free card and samples at coumadin clinic front desk for patient to pick up. [facility]. Patient will have cardioversion as planned on 7/7.  Appointment made for patient to see Dr Ladona on 7/29.

## 2023-07-16 NOTE — Telephone Encounter (Signed)
 Medication name/dosage: Samples List: Eliquis  5 mg  Administration instructions: take one tablet by mouth twice daily   Reason for samples: Reason for samples: new start  Ordering provider:  Dr Ladona  *Once above information entered, route the phone encounter to [facility] DIV MAG ST SAMPLES and send Teams message to team member assigned to Samples for the day.

## 2023-07-16 NOTE — Telephone Encounter (Signed)
 Reviewed with Dr Ladona and patient to stop [place] study drug and start Eliquis  5 mg by mouth twice daily.  He should be scheduled for cardioversion 3 weeks after starting Eliquis .   I spoke with patient.  He reports he was told he was to stop study drug for 2 days prior to cardioversion and start Eliquis . He should continue Eliquis  for 2 days post cardioversion and the resume study drug.  Patient would like to keep cardioversion scheduled for 7/7 if possible.  Eliquis  5 mg samples are available in office.  Will forward to Dr Ladona for clarification.

## 2023-07-17 ENCOUNTER — Ambulatory Visit: Payer: Self-pay | Admitting: Cardiology

## 2023-07-17 LAB — EXERCISE TOLERANCE TEST
Estimated workload: 3.5
Exercise duration (min): 1 min
Exercise duration (sec): 27 s
MPHR: 125 {beats}/min
Peak HR: 93 {beats}/min
Percent HR: 63 %
Rest HR: 69 {beats}/min

## 2023-07-17 LAB — BASIC METABOLIC PANEL WITH GFR
BUN/Creatinine Ratio: 14 (ref 10–24)
BUN: 20 mg/dL (ref 8–27)
CO2: 21 mmol/L (ref 20–29)
Calcium: 9 mg/dL (ref 8.6–10.2)
Chloride: 103 mmol/L (ref 96–106)
Creatinine, Ser: 1.46 mg/dL — ABNORMAL HIGH (ref 0.76–1.27)
Glucose: 86 mg/dL (ref 70–99)
Potassium: 4.8 mmol/L (ref 3.5–5.2)
Sodium: 141 mmol/L (ref 134–144)
eGFR: 50 mL/min/{1.73_m2} — ABNORMAL LOW (ref >59)

## 2023-07-17 NOTE — Progress Notes (Signed)
 Left detailed VM for  patient and instructed them to come at 0700  and to be NPO after 0000.  Medications reviewed and patient instructed to take Eliquis , Flecainide  and metoprolol  the morning of his procedure.   Advised that patient have a ride home and someone to stay with them for 24 hours after the procedure.   Left number to call back if they have additional questions.

## 2023-07-17 NOTE — Progress Notes (Signed)
 GXT performed for therapeutic drug monitoring on flecainide , no prolongation of QT, no widening of QRS complex, no significant arrhythmias, overall low risk treadmill stress test and nondiagnostic for ischemia in view of chronotropic incompetence and poor exercise tolerance.  Will continue flecainide .

## 2023-07-20 NOTE — Anesthesia Preprocedure Evaluation (Signed)
 Anesthesia Evaluation  Patient identified by MRN, date of birth, ID band Patient awake    Reviewed: Allergy & Precautions, NPO status , Patient's Chart, lab work & pertinent test results  Airway Mallampati: II  TM Distance: >3 FB Neck ROM: Full    Dental  (+) Dental Advisory Given, Teeth Intact   Pulmonary neg pulmonary ROS   Pulmonary exam normal breath sounds clear to auscultation       Cardiovascular hypertension, Pt. on medications + dysrhythmias Atrial Fibrillation  Rhythm:Irregular Rate:Normal  Echo 10/20224  1. Left ventricular ejection fraction, by estimation, is 50 to 55%. The left ventricle has low normal function. The left ventricle has no regional wall motion abnormalities. There is mild concentric left ventricular hypertrophy. Left ventricular diastolic parameters are indeterminate.   2. Right ventricular systolic function is normal. The right ventricular size is normal.   3. Left atrial size was mildly dilated.   4. Right atrial size was mildly dilated.   5. The mitral valve is normal in structure. Trivial mitral valve regurgitation. No evidence of mitral stenosis.   6. The aortic valve is tricuspid. There is mild calcification of the aortic valve. Aortic valve regurgitation is not visualized. No aortic stenosis is present.   7. Aortic dilatation noted. There is mild dilatation of the ascending aorta, measuring 39 mm.   8. The patient was in atrial fibrillation.   9. The inferior vena cava is normal in size with greater than 50% respiratory variability, suggesting right atrial pressure of 3 mmHg.      Neuro/Psych CVA    GI/Hepatic negative GI ROS, Neg liver ROS,,,  Endo/Other  negative endocrine ROS    Renal/GU negative Renal ROS     Musculoskeletal  (+) Arthritis ,    Abdominal   Peds  Hematology negative hematology ROS (+)   Anesthesia Other Findings   Reproductive/Obstetrics                               Anesthesia Physical Anesthesia Plan  ASA: 3  Anesthesia Plan: General   Post-op Pain Management: Minimal or no pain anticipated   Induction: Intravenous  PONV Risk Score and Plan:   Airway Management Planned: Natural Airway and Simple Face Mask  Additional Equipment:   Intra-op Plan:   Post-operative Plan:   Informed Consent: I have reviewed the patients History and Physical, chart, labs and discussed the procedure including the risks, benefits and alternatives for the proposed anesthesia with the patient or authorized representative who has indicated his/her understanding and acceptance.     Dental advisory given  Plan Discussed with: CRNA  Anesthesia Plan Comments:         Anesthesia Quick Evaluation

## 2023-07-21 ENCOUNTER — Encounter (HOSPITAL_COMMUNITY): Payer: Self-pay

## 2023-07-21 ENCOUNTER — Ambulatory Visit (HOSPITAL_COMMUNITY)
Admission: RE | Admit: 2023-07-21 | Discharge: 2023-07-21 | Disposition: A | Discharge status: Home / Self Care | Source: Ambulatory Visit | Attending: Internal Medicine | Admitting: Internal Medicine

## 2023-07-21 ENCOUNTER — Ambulatory Visit (HOSPITAL_BASED_OUTPATIENT_CLINIC_OR_DEPARTMENT_OTHER): Payer: Self-pay

## 2023-07-21 ENCOUNTER — Other Ambulatory Visit: Payer: Self-pay

## 2023-07-21 ENCOUNTER — Encounter (HOSPITAL_COMMUNITY): Payer: Self-pay | Admitting: Internal Medicine

## 2023-07-21 ENCOUNTER — Encounter (HOSPITAL_COMMUNITY)
Admission: RE | Disposition: A | Discharge status: Home / Self Care | Payer: Self-pay | Source: Ambulatory Visit | Attending: Internal Medicine

## 2023-07-21 DIAGNOSIS — I10 Essential (primary) hypertension: Secondary | ICD-10-CM

## 2023-07-21 DIAGNOSIS — I4819 Other persistent atrial fibrillation: Secondary | ICD-10-CM | POA: Diagnosis present

## 2023-07-21 DIAGNOSIS — Z79899 Other long term (current) drug therapy: Secondary | ICD-10-CM | POA: Insufficient documentation

## 2023-07-21 DIAGNOSIS — I4891 Unspecified atrial fibrillation: Secondary | ICD-10-CM | POA: Diagnosis not present

## 2023-07-21 HISTORY — PX: CARDIOVERSION: EP1203

## 2023-07-21 SURGERY — CARDIOVERSION (CATH LAB)
Anesthesia: General

## 2023-07-21 MED ORDER — LIDOCAINE 2% (20 MG/ML) 5 ML SYRINGE
INTRAMUSCULAR | Status: DC | PRN
  Administered 2023-07-21: 100 mg via INTRAVENOUS

## 2023-07-21 MED ORDER — SODIUM CHLORIDE 0.9% FLUSH: 3.0000 mL | Freq: Two times a day (BID) | INTRAVENOUS | Status: DC

## 2023-07-21 MED ORDER — SODIUM CHLORIDE 0.9% FLUSH: 3.0000 mL | INTRAVENOUS | Status: DC | PRN

## 2023-07-21 MED ORDER — PROPOFOL 10 MG/ML IV BOLUS
INTRAVENOUS | Status: DC | PRN
  Administered 2023-07-21: 70 mg via INTRAVENOUS

## 2023-07-21 SURGICAL SUPPLY — 1 items: PAD DEFIB RADIO PHYSIO CONN (PAD) ×2 IMPLANT

## 2023-07-21 NOTE — Anesthesia Postprocedure Evaluation (Signed)
 Anesthesia Post Note  Patient: Steve Murphy  Procedure(s) Performed: CARDIOVERSION     Patient location during evaluation: PACU Anesthesia Type: General Level of consciousness: awake and alert Pain management: pain level controlled Vital Signs Assessment: post-procedure vital signs reviewed and stable Respiratory status: spontaneous breathing, nonlabored ventilation, respiratory function stable and patient connected to nasal cannula oxygen Cardiovascular status: blood pressure returned to baseline and stable Postop Assessment: no apparent nausea or vomiting Anesthetic complications: no   There were no known notable events for this encounter.  Last Vitals:  Vitals:   07/21/23 0820 07/21/23 0825  BP: (!) 130/110 (!) 145/91  Pulse: 61 (!) 58  Resp: 13 19  Temp:    SpO2: 96% 96%    Last Pain:  Vitals:   07/21/23 0800  TempSrc:   PainSc: 0-No pain                 Thom JONELLE Peoples

## 2023-07-21 NOTE — Interval H&P Note (Signed)
 History and Physical Interval Note:  07/21/2023 7:43 AM  Steve Murphy  has presented today for surgery, with the diagnosis of AFIB.  The various methods of treatment have been discussed with the patient and family. After consideration of risks, benefits and other options for treatment, the patient has consented to  Procedure(s): CARDIOVERSION (N/A) as a surgical intervention.  The patient's history has been reviewed, patient examined, no change in status, stable for surgery.  I have reviewed the patient's chart and labs.  Questions were answered to the patient's satisfaction.     [name]

## 2023-07-21 NOTE — CV Procedure (Signed)
    CARDIOVERSION NOTE  Procedure: Electrical Cardioversion Indications:  Atrial Fibrillation  Procedure Details:  Consent: Risks of procedure as well as the alternatives and risks of each were explained to the (patient/caregiver).  Consent for procedure obtained.  Time Out: Verified patient identification, verified procedure, site/side was marked, verified correct patient position, special equipment/implants available, medications/allergies/relevent history reviewed, required imaging and test results available.  Performed  Steve Murphy is enrolled in the Librexia-AF study - the PI Dr. Rosemarie and Dr. Ladona communicated the change in anticoagulation to Eliquis  last week with plans for DCCV today.  Patient placed on cardiac monitor, pulse oximetry, supplemental oxygen as necessary.  Sedation given: propofol  per anesthesia Pacer pads placed anterior and posterior chest.  Cardioverted 3 time(s).  Cardioverted at 300J and 360J x 2 biphasic (with pressure and pad repositioning).  Impression: Findings: Post procedure EKG shows: Atrial Fibrillation Complications: None Patient did tolerate procedure well.  Plan: Unsuccessful DCCV despite 3 stacked shocks with pressure and pad repositioning. No sinus beats were noted after each shock. Consider alternative AAD therapy vs referral for ablation if he is symptomatic.  Time Spent Directly with the Patient:  30 minutes   Steve KYM Maxcy, MD, [facility], FNLA, FACP  [place]  [facility]  Medical Director of the Advanced Lipid Disorders &  Cardiovascular Risk Reduction Clinic Diplomate of the American Board of Clinical Lipidology Attending Cardiologist  Direct Dial: [phone]  Fax: [phone]  Website:  [email or web address]  Steve Murphy 07/21/2023, 7:57 AM

## 2023-07-21 NOTE — Transfer of Care (Signed)
 Immediate Anesthesia Transfer of Care Note  Patient: Steve Murphy  Procedure(s) Performed: CARDIOVERSION  Patient Location: Cath Lab  Anesthesia Type:General  Level of Consciousness: awake, alert , and oriented  Airway & Oxygen Therapy: Patient Spontanous Breathing and Patient connected to nasal cannula oxygen  Post-op Assessment: Report given to RN and Post -op Vital signs reviewed and stable  Post vital signs: Reviewed and stable  Last Vitals:  Vitals Value Taken Time  BP 142/92 0756  Temp 36 0756  Pulse 63 0756  Resp 12 0756  SpO2 96 0756    Last Pain:  Vitals:   07/21/23 0749  TempSrc:   PainSc: 0-No pain         Complications: No notable events documented.

## 2023-07-24 ENCOUNTER — Other Ambulatory Visit: Payer: Self-pay

## 2023-07-25 ENCOUNTER — Other Ambulatory Visit (HOSPITAL_COMMUNITY): Payer: Self-pay

## 2023-07-25 ENCOUNTER — Other Ambulatory Visit: Payer: Self-pay

## 2023-08-12 ENCOUNTER — Encounter: Payer: Self-pay | Admitting: Cardiology

## 2023-08-12 ENCOUNTER — Ambulatory Visit: Attending: Cardiology | Admitting: Cardiology

## 2023-08-12 VITALS — BP 130/80 | HR 49 | Resp 16 | Ht 71.0 in | Wt 226.6 lb

## 2023-08-12 DIAGNOSIS — I10 Essential (primary) hypertension: Secondary | ICD-10-CM | POA: Insufficient documentation

## 2023-08-12 DIAGNOSIS — I4819 Other persistent atrial fibrillation: Secondary | ICD-10-CM | POA: Insufficient documentation

## 2023-08-12 DIAGNOSIS — Z006 Encounter for examination for normal comparison and control in clinical research program: Secondary | ICD-10-CM | POA: Diagnosis not present

## 2023-08-12 NOTE — Patient Instructions (Addendum)
 Medication Instructions:  Fleurette current supply of Eliquis  and then resume study drug *If you need a refill on your cardiac medications before your next appointment, please call your pharmacy*  Lab Work: none If you have labs (blood work) drawn today and your tests are completely normal, you will receive your results only by: MyChart Message (if you have MyChart) OR A paper copy in the mail If you have any lab test that is abnormal or we need to change your treatment, we will call you to review the results.  Testing/Procedures: none  Follow-Up: At [facility] you and your health needs are our priority.  As part of our continuing mission to provide you with exceptional heart care, our providers are all part of one team.  This team includes your primary Cardiologist (physician) and Advanced Practice Providers or APPs (Physician Assistants and Nurse Practitioners) who all work together to provide you with the care you need, when you need it.  Your next appointment:  October 17 at 3 PM   Provider:   Gordy Bergamo, MD    We recommend signing up for the patient portal called MyChart.  Sign up information is provided on this After Visit Summary.  MyChart is used to connect with patients for Virtual Visits (Telemedicine).  Patients are able to view lab/test results, encounter notes, upcoming appointments, etc.  Non-urgent messages can be sent to your provider as well.   To learn more about what you can do with MyChart, go to [email or web address].   Other Instructions

## 2023-08-12 NOTE — Progress Notes (Signed)
 Cardiology Office Note:  .   Date:  08/12/2023  ID:  Steve Murphy, DOB 1950/11/14, MRN [id number] PCP: Barbra Odor, NP  [facility] Providers Cardiologist:  Gordy Bergamo, MD   History of Present Illness: .   Steve Murphy is a 73 y.o. Caucasian male patient with paroxysmal atrial fibrillation new onset on 11/06/2022, underwent direct-current cardioversion on 02/27/2023 to sinus rhythm.  He presents for a 19-month office visit.  Echocardiogram on 11/06/2022 revealed normal LVEF.  His past medical history is significant for hypertension.  He is enrolled in [place] study with regard to anticoagulation ([id number] (Milvexian) 100 mg or placebo tablet (PI-Sethi), Take 1 tablet by mouth 2 (two) times daily).  He presented to me on 06/27/2023 with fatigue and slowing down and he was back in atrial fibrillation.  He underwent transition to Eliquis  2 days prior to cardioversion, underwent direct-current cardioversion on 07/21/2023 with 3 attempts and unfortunately was not successful at 360 J.  He now presents for follow-up.  Discussed the use of AI scribe software for clinical note transcription with the patient, who gave verbal consent to proceed.  History of Present Illness Steve Murphy is a 73 year old male with atrial fibrillation who presents with fatigue and tiredness.  He underwent direct current cardioversion on August 08, 2023, with three attempts at 360 joules, which were unsuccessful. He continues to experience significant fatigue and tiredness since the procedure.  He is on metoprolol  25 mg twice daily and losartan  25 mg once daily. His home blood pressure readings are generally well controlled at approximately 120/60 mmHg upon waking. During a recent treadmill stress test, his blood pressure dropped post-exercise, which he found concerning.  Labs   Lab Results  Component Value Date   CHOL 111 01/03/2023   HDL 38 (L) 01/03/2023   LDLCALC 58 01/03/2023   TRIG 74  01/03/2023   CHOLHDL 2.9 01/03/2023   No results found for: LIPOA  Lab Results  Component Value Date   NA 141 07/16/2023   K 4.8 07/16/2023   CO2 21 07/16/2023   GLUCOSE 86 07/16/2023   BUN 20 07/16/2023   CREATININE 1.46 (H) 07/16/2023   CALCIUM 9.0 07/16/2023   EGFR 50 (L) 07/16/2023   GFRNONAA >60 01/03/2023      Latest Ref Rng & Units 07/16/2023    4:40 PM 02/24/2023    5:02 PM 01/03/2023    5:56 AM  BMP  Glucose 70 - 99 mg/dL 86  93  92   BUN 8 - 27 mg/dL 20  19  14    Creatinine 0.76 - 1.27 mg/dL 8.53  8.70  8.78   BUN/Creat Ratio 10 - 24 14  15     Sodium 134 - 144 mmol/L 141  142  141   Potassium 3.5 - 5.2 mmol/L 4.8  5.1  3.9   Chloride 96 - 106 mmol/L 103  105  109   CO2 20 - 29 mmol/L 21  23  25    Calcium 8.6 - 10.2 mg/dL 9.0  9.5  8.8       Latest Ref Rng & Units 07/16/2023    4:39 PM 02/24/2023    5:02 PM 01/03/2023    5:56 AM  CBC  WBC 3.4 - 10.8 x10E3/uL 6.7  6.4  6.8   Hemoglobin 13.0 - 17.7 g/dL 84.9  84.7  85.9   Hematocrit 37.5 - 51.0 % 45.2  46.9  42.1   Platelets 150 - 450 x10E3/uL 219  242  224    Lab Results  Component Value Date   HGBA1C 5.0 01/02/2023   TSH 10/25/2022: Normal at 0.69.  T4 normal at 8.3.  ROS  Review of Systems  Constitutional: Positive for malaise/fatigue.  Cardiovascular:  Negative for chest pain, dyspnea on exertion and leg swelling.   Physical Exam:   VS:  BP 130/80 (BP Location: Left Arm, Patient Position: Sitting, Cuff Size: Normal)   Pulse (!) 49   Resp 16   Ht 5' 11 (1.803 m)   Wt 226 lb 9.6 oz (102.8 kg)   SpO2 98%   BMI 31.60 kg/m    Wt Readings from Last 3 Encounters:  08/12/23 226 lb 9.6 oz (102.8 kg)  07/21/23 218 lb (98.9 kg)  06/27/23 216 lb 12.8 oz (98.3 kg)    Physical Exam Neck:     Vascular: No carotid bruit or JVD.  Cardiovascular:     Rate and Rhythm: Normal rate and regular rhythm.     Pulses: Intact distal pulses.     Heart sounds: Normal heart sounds. No murmur heard.    No gallop.   Pulmonary:     Effort: Pulmonary effort is normal.     Breath sounds: Normal breath sounds.  Abdominal:     General: Bowel sounds are normal.     Palpations: Abdomen is soft.  Musculoskeletal:     Right lower leg: No edema.     Left lower leg: No edema.    Studies Reviewed: SABRA     EKG:    EKG Interpretation Date/Time:  Tuesday August 12 2023 14:59:46 EDT Ventricular Rate:  63 PR Interval:    QRS Duration:  92 QT Interval:  406 QTC Calculation: 415 R Axis:   75  Text Interpretation: EKG 08/12/2023: Atrial fibrillation with controlled ventricular response at the rate of 63 bpm, otherwise normal EKG.  Compared to 06/27/2023, no significant change.  Compared to 02/27/2023, sinus rhythm has been replaced. Confirmed by Ezrah Panning, Jagadeesh ([id number]) on 08/12/2023 3:21:11 PM    Medications ordered    No orders of the defined types were placed in this encounter.    ASSESSMENT AND PLAN: .      ICD-10-CM   1. Persistent atrial fibrillation (HCC)  I48.19 EKG 12-Lead    2. Primary hypertension  I10     3. Enrolled in clinical trial of drug: LIBREXIA-AF, oral factor XIa inhibitor versus apixaban   Z00.6       Assessment and Plan Assessment & Plan Atrial fibrillation Persistent atrial fibrillation with failed cardioversion attempt on August 08, 2023. Heart rate is well controlled, and EKG is normal except for AFib. Exercise capacity is low due to AFib. - Refer to EP for evaluation and potential ablation, has an appointment to see Dr. Inocencio soon - Maintain current medications  Fatigue Severe fatigue likely related to atrial fibrillation. - Proceed with scheduled sleep study at [facility], being followed by Dr. Wilbert Bihari  Hypertension Blood pressure is well controlled at home with readings around 120/60 mmHg. Exercise-induced blood pressure response is normal. - Continue current antihypertensive regimen  Obesity Weight gain likely due to fatigue and decreased activity level.  Current medications (metoprolol  and losartan ) at low doses are unlikely to contribute significantly to weight gain.  Office visit in 3 to 4 months. Signed,  Gordy Bergamo, MD, [facility] 08/12/2023, 6:31 PM [facility] [address] [place], [place] [address] Phone: [phone]. Fax:  [phone]

## 2023-08-19 ENCOUNTER — Encounter: Payer: Self-pay | Admitting: Cardiology

## 2023-08-19 ENCOUNTER — Other Ambulatory Visit: Payer: Self-pay | Admitting: *Deleted

## 2023-08-19 ENCOUNTER — Ambulatory Visit: Attending: Cardiovascular Disease | Admitting: Cardiology

## 2023-08-19 VITALS — BP 171/104 | HR 72 | Ht 71.0 in | Wt 220.0 lb

## 2023-08-19 DIAGNOSIS — I4819 Other persistent atrial fibrillation: Secondary | ICD-10-CM | POA: Insufficient documentation

## 2023-08-19 DIAGNOSIS — Z01812 Encounter for preprocedural laboratory examination: Secondary | ICD-10-CM | POA: Diagnosis present

## 2023-08-19 DIAGNOSIS — D6869 Other thrombophilia: Secondary | ICD-10-CM | POA: Diagnosis present

## 2023-08-19 DIAGNOSIS — I10 Essential (primary) hypertension: Secondary | ICD-10-CM | POA: Insufficient documentation

## 2023-08-19 MED ORDER — AMIODARONE HCL 200 MG PO TABS: ORAL_TABLET | ORAL | 3 refills | Status: DC

## 2023-08-19 NOTE — Progress Notes (Signed)
 Electrophysiology Office Note:   Date:  08/19/2023  ID:  Steve Murphy, DOB [birth year], MRN [id number]  Primary Cardiologist: Gordy Bergamo, MD Primary Heart Failure: None Electrophysiologist: None      History of Present Illness:   Steve Murphy is a 73 y.o. male with h/o atrial fibrillation seen today for  for Electrophysiology evaluation of atrial fibrillation, hypertension at the request of Gordy Bergamo.    Atrial fibrillation was diagnosed 11/06/2022.  He had cardioversion 02/27/2023.  Echo showed a normal ejection fraction.  He presented to cardiology clinic 06/27/2023 with fatigue and weakness.  He was found to be in atrial fibrillation and is post cardioversion 07/21/2023.  Discussed the use of AI scribe software for clinical note transcription with the patient, who gave verbal consent to proceed.  History of Present Illness   Steve Murphy is a 73 year old male with atrial fibrillation who presents for evaluation of persistent arrhythmia.  Atrial fibrillation was first identified approximately five or six years ago. In October 2024, atrial fibrillation was detected during pre-operative evaluations for knee replacement surgery. 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 Since early January 2025, he has been on blood thinners as part of a study medication regimen.  He has undergone two cardioversions this year. The first was temporarily successful, but the most recent, three weeks ago, involved three shocks with 360 joules and was unsuccessful in maintaining normal rhythm.  He experiences breathlessness and dizziness, impacting his daily activities. He is currently on flecainide . 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 He has an upcoming trip planned to [facility] on September 8th.       Review of systems complete and found to be negative unless listed in HPI.   EP Information / Studies Reviewed:    EKG is not ordered today. EKG from  08/12/2023 reviewed which showed atrial fibrillation      Risk Assessment/Calculations:    CHA2DS2-VASc Score = 2   This indicates a 2.2% annual risk of stroke. The patient's score is based upon: CHF History: 0 HTN History: 1 Diabetes History: 0 Stroke History: 0 Vascular Disease History: 0 Age Score: 1 Gender Score: 0            Physical Exam:   VS:  There were no vitals taken for this visit.   Wt Readings from Last 3 Encounters:  08/12/23 226 lb 9.6 oz (102.8 kg)  07/21/23 218 lb (98.9 kg)  06/27/23 216 lb 12.8 oz (98.3 kg)     GEN: Well nourished, well developed in no acute distress NECK: No JVD; No carotid bruits CARDIAC: Regular rate and rhythm, no murmurs, rubs, gallops RESPIRATORY:  Clear to auscultation without rales, wheezing or rhonchi  ABDOMEN: Soft, non-tender, non-distended EXTREMITIES:  No edema; No deformity   ASSESSMENT AND PLAN:    1.  Persistent atrial fibrillation: On metoprolol  and flecainide .  Sinew to have episodes of atrial fibrillation that make him feel quite fatigued and weak.  He would benefit from rhythm control.  He has had a cardioversion did not convert.  Elvyn Krohn plan for amiodarone  load and repeat cardioversion.  Long-term, we Bernise Sylvain plan for atrial fibrillation ablation.  He would prefer to avoid long-term antiarrhythmics.  Risk, benefits, and alternatives to EP study and radiofrequency/pulse field ablation for afib were also discussed in detail today. These risks include but are not limited to stroke, bleeding, vascular damage, tamponade, perforation, damage to the esophagus, lungs, and  other structures, pulmonary vein stenosis, worsening renal function, and death. The patient understands these risk and wishes to proceed.  We Tahtiana Rozier therefore proceed with catheter ablation at the next available time.  Carto, ICE, anesthesia are requested for the procedure.  This patient Rashard Ryle NOT require CT prior to ablation  2.  Secondary hypercoagulable state: On a  study drug.  Lelani Garnett need to check with that study to see if he needs to continue this medication through cardioversion and ablation or switch to Eliquis .  3.  Hypertension: Elevated today.  Usually well-controlled.  Plan per primary cardiology.  Follow up with Afib Clinic as usual post procedure  Signed, Lynnetta Tom Gladis Norton, MD

## 2023-08-19 NOTE — H&P (View-Only) (Signed)
 Electrophysiology Office Note:   Date:  08/19/2023  ID:  Steve Murphy, DOB [birth year], MRN [id number]  Primary Cardiologist: Gordy Bergamo, MD Primary Heart Failure: None Electrophysiologist: None      History of Present Illness:   Steve Murphy is a 73 y.o. male with h/o atrial fibrillation seen today for  for Electrophysiology evaluation of atrial fibrillation, hypertension at the request of Gordy Bergamo.    Atrial fibrillation was diagnosed 11/06/2022.  He had cardioversion 02/27/2023.  Echo showed a normal ejection fraction.  He presented to cardiology clinic 06/27/2023 with fatigue and weakness.  He was found to be in atrial fibrillation and is post cardioversion 07/21/2023.  Discussed the use of AI scribe software for clinical note transcription with the patient, who gave verbal consent to proceed.  History of Present Illness   Steve Murphy is a 73 year old male with atrial fibrillation who presents for evaluation of persistent arrhythmia.  Atrial fibrillation was first identified approximately five or six years ago. In October 2024, atrial fibrillation was detected during pre-operative evaluations for knee replacement surgery. 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 Since early January 2025, he has been on blood thinners as part of a study medication regimen.  He has undergone two cardioversions this year. The first was temporarily successful, but the most recent, three weeks ago, involved three shocks with 360 joules and was unsuccessful in maintaining normal rhythm.  He experiences breathlessness and dizziness, impacting his daily activities. He is currently on flecainide . 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 He has an upcoming trip planned to [facility] on September 8th.       Review of systems complete and found to be negative unless listed in HPI.   EP Information / Studies Reviewed:    EKG is not ordered today. EKG from  08/12/2023 reviewed which showed atrial fibrillation      Risk Assessment/Calculations:    CHA2DS2-VASc Score = 2   This indicates a 2.2% annual risk of stroke. The patient's score is based upon: CHF History: 0 HTN History: 1 Diabetes History: 0 Stroke History: 0 Vascular Disease History: 0 Age Score: 1 Gender Score: 0            Physical Exam:   VS:  There were no vitals taken for this visit.   Wt Readings from Last 3 Encounters:  08/12/23 226 lb 9.6 oz (102.8 kg)  07/21/23 218 lb (98.9 kg)  06/27/23 216 lb 12.8 oz (98.3 kg)     GEN: Well nourished, well developed in no acute distress NECK: No JVD; No carotid bruits CARDIAC: Regular rate and rhythm, no murmurs, rubs, gallops RESPIRATORY:  Clear to auscultation without rales, wheezing or rhonchi  ABDOMEN: Soft, non-tender, non-distended EXTREMITIES:  No edema; No deformity   ASSESSMENT AND PLAN:    1.  Persistent atrial fibrillation: On metoprolol  and flecainide .  Sinew to have episodes of atrial fibrillation that make him feel quite fatigued and weak.  He would benefit from rhythm control.  He has had a cardioversion did not convert.  Elvyn Krohn plan for amiodarone  load and repeat cardioversion.  Long-term, we Bernise Sylvain plan for atrial fibrillation ablation.  He would prefer to avoid long-term antiarrhythmics.  Risk, benefits, and alternatives to EP study and radiofrequency/pulse field ablation for afib were also discussed in detail today. These risks include but are not limited to stroke, bleeding, vascular damage, tamponade, perforation, damage to the esophagus, lungs, and  other structures, pulmonary vein stenosis, worsening renal function, and death. The patient understands these risk and wishes to proceed.  We Tahtiana Rozier therefore proceed with catheter ablation at the next available time.  Carto, ICE, anesthesia are requested for the procedure.  This patient Rashard Ryle NOT require CT prior to ablation  2.  Secondary hypercoagulable state: On a  study drug.  Lelani Garnett need to check with that study to see if he needs to continue this medication through cardioversion and ablation or switch to Eliquis .  3.  Hypertension: Elevated today.  Usually well-controlled.  Plan per primary cardiology.  Follow up with [facility] Clinic as usual post procedure  Signed, Lynnetta Tom Gladis Norton, MD

## 2023-08-19 NOTE — Patient Instructions (Signed)
 Medication Instructions:  Your physician has recommended you make the following change in your medication:  START Amiodarone   - take 2 tablets (400 mg total) TWICE a day for 2 weeks, then  - take 1 tablet (200 mg total) TWICE a day for 2 weeks, then  - take 1 tablet (200 mg total) ONCE a day   *If you need a refill on your cardiac medications before your next appointment, please call your pharmacy*   Lab Work: Cardioversion pre procedure labs: BMP & CBC  Ablation pre procedure labs -- we will call you to schedule:  BMP & CBC  If you have a lab test that is abnormal and we need to change your treatment, we will call you to review the results -- otherwise no news is good news.    Testing/Procedures: Your physician has recommended that you have a Cardioversion (DCCV). Electrical Cardioversion uses a jolt of electricity to your heart either through paddles or wired patches attached to your chest. This is a controlled, usually prescheduled, procedure. Defibrillation is done under light anesthesia in the hospital, and you usually go home the day of the procedure. This is done to get your heart back into a normal rhythm. You are not awake for the procedure. Please see the instruction sheet given to you today.   Your physician has recommended that you have an ablation. Catheter ablation is a medical procedure used to treat some cardiac arrhythmias (irregular heartbeats). During catheter ablation, a long, thin, flexible tube is put into a blood vessel in your groin (upper thigh), or neck. This tube is called an ablation catheter. It is then guided to your heart through the blood vessel. Radio frequency waves destroy small areas of heart tissue where abnormal heartbeats may cause an arrhythmia to start.   The office will call you to arrange this procedure in October/November   Follow-Up: At [facility], you and your health needs are our priority.  As part of our continuing mission to provide you  with exceptional heart care, we have created designated Provider Care Teams.  These Care Teams include your primary Cardiologist (physician) and Advanced Practice Providers (APPs -  Physician Assistants and Nurse Practitioners) who all work together to provide you with the care you need, when you need it.  Your next appointment:   1 month(s) after your ablation  The format for your next appointment:   In Person  Provider:   AFib clinic   Thank you for choosing [facility]!!   Maeola Domino, RN [phone]    Other Instructions   Cardiac Ablation Cardiac ablation is a procedure to destroy (ablate) some heart tissue that is sending bad signals. These bad signals cause problems in heart rhythm. The heart has many areas that make these signals. If there are problems in these areas, they can make the heart beat in a way that is not normal. Destroying some tissues can help make the heart rhythm normal. Tell your doctor about: Any allergies you have. All medicines you are taking. These include vitamins, herbs, eye drops, creams, and over-the-counter medicines. Any problems you or family members have had with medicines that make you fall asleep (anesthetics). Any blood disorders you have. Any surgeries you have had. Any medical conditions you have, such as kidney failure. Whether you are pregnant or may be pregnant. What are the risks? This is a safe procedure. But problems may occur, including: Infection. Bruising and bleeding. Bleeding into the chest. Stroke or blood clots.  Damage to nearby areas of your body. Allergies to medicines or dyes. The need for a pacemaker if the normal system is damaged. Failure of the procedure to treat the problem. What happens before the procedure? Medicines Ask your doctor about: Changing or stopping your normal medicines. This is important. Taking aspirin and ibuprofen. Do not take these medicines unless your doctor tells you to take  them. Taking other medicines, vitamins, herbs, and supplements. General instructions Follow instructions from your doctor about what you cannot eat or drink. Plan to have someone take you home from the hospital or clinic. If you will be going home right after the procedure, plan to have someone with you for 24 hours. Ask your doctor what steps will be taken to prevent infection. What happens during the procedure?  An IV tube will be put into one of your veins. You will be given a medicine to help you relax. The skin on your neck or groin will be numbed. A cut (incision) will be made in your neck or groin. A needle will be put through your cut and into a large vein. A tube (catheter) will be put into the needle. The tube will be moved to your heart. Dye may be put through the tube. This helps your doctor see your heart. Small devices (electrodes) on the tube will send out signals. A type of energy will be used to destroy some heart tissue. The tube will be taken out. Pressure will be held on your cut. This helps stop bleeding. A bandage will be put over your cut. The exact procedure may vary among doctors and hospitals. What happens after the procedure? You will be watched until you leave the hospital or clinic. This includes checking your heart rate, breathing rate, oxygen, and blood pressure. Your cut will be watched for bleeding. You will need to lie still for a few hours. Do not drive for 24 hours or as long as your doctor tells you. Summary Cardiac ablation is a procedure to destroy some heart tissue. This is done to treat heart rhythm problems. Tell your doctor about any medical conditions you may have. Tell him or her about all medicines you are taking to treat them. This is a safe procedure. But problems may occur. These include infection, bruising, bleeding, and damage to nearby areas of your body. Follow what your doctor tells you about food and drink. You may also be told to  change or stop some of your medicines. After the procedure, do not drive for 24 hours or as long as your doctor tells you. This information is not intended to replace advice given to you by your health care provider. Make sure you discuss any questions you have with your health care provider. Document Revised: 03/23/2021 Document Reviewed: 12/03/2018 Elsevier Patient Education  2023 Elsevier Inc.   Cardiac Ablation, Care After  This sheet gives you information about how to care for yourself after your procedure. Your health care provider may also give you more specific instructions. If you have problems or questions, contact your health care provider. What can I expect after the procedure? After the procedure, it is common to have: Bruising around your puncture site. Tenderness around your puncture site. Skipped heartbeats. If you had an atrial fibrillation ablation, you may have atrial fibrillation during the first several months after your procedure.  Tiredness (fatigue).  Follow these instructions at home: Puncture site care  Follow instructions from your health care provider about how to take care  of your puncture site. Make sure you: If present, leave stitches (sutures), skin glue, or adhesive strips in place. These skin closures may need to stay in place for up to 2 weeks. If adhesive strip edges start to loosen and curl up, you may trim the loose edges. Do not remove adhesive strips completely unless your health care provider tells you to do that. If a large square bandage is present, this may be removed 24 hours after surgery.  Check your puncture site every day for signs of infection. Check for: Redness, swelling, or pain. Fluid or blood. If your puncture site starts to bleed, lie down on your back, apply firm pressure to the area, and contact your health care provider. Warmth. Pus or a bad smell. A pea or small marble sized lump at the site is normal and can take up to three  months to resolve.  Driving Do not drive for at least 4 days after your procedure or however long your health care provider recommends. (Do not resume driving if you have previously been instructed not to drive for other health reasons.) Do not drive or use heavy machinery while taking prescription pain medicine. Activity Avoid activities that take a lot of effort for at least 7 days after your procedure. Do not lift anything that is heavier than 5 lb (4.5 kg) for one week.  No sexual activity for 1 week.  Return to your normal activities as told by your health care provider. Ask your health care provider what activities are safe for you. General instructions Take over-the-counter and prescription medicines only as told by your health care provider. Do not use any products that contain nicotine or tobacco, such as cigarettes and e-cigarettes. If you need help quitting, ask your health care provider. You may shower after 24 hours, but Do not take baths, swim, or use a hot tub for 1 week.  Do not drink alcohol for 24 hours after your procedure. Keep all follow-up visits as told by your health care provider. This is important. Contact a health care provider if: You have redness, mild swelling, or pain around your puncture site. You have fluid or blood coming from your puncture site that stops after applying firm pressure to the area. Your puncture site feels warm to the touch. You have pus or a bad smell coming from your puncture site. You have a fever. You have chest pain or discomfort that spreads to your neck, jaw, or arm. You have chest pain that is worse with lying on your back or taking a deep breath. You are sweating a lot. You feel nauseous. You have a fast or irregular heartbeat. You have shortness of breath. You are dizzy or light-headed and feel the need to lie down. You have pain or numbness in the arm or leg closest to your puncture site. Get help right away if: Your puncture  site suddenly swells. Your puncture site is bleeding and the bleeding does not stop after applying firm pressure to the area. These symptoms may represent a serious problem that is an emergency. Do not wait to see if the symptoms will go away. Get medical help right away. Call your local emergency services (911 in the [place] Do not drive yourself to the hospital. Summary After the procedure, it is normal to have bruising and tenderness at the puncture site in your groin, neck, or forearm. Check your puncture site every day for signs of infection. Get help right away if your puncture site  is bleeding and the bleeding does not stop after applying firm pressure to the area. This is a medical emergency. This information is not intended to replace advice given to you by your health care provider. Make sure you discuss any questions you have with your health care provider.          Dear Steve Murphy  You are scheduled for a Cardioversion on Tuesday, August 26 with Dr. Mona.  Please arrive at the [facility] (Main Entrance A) at [facility]: [address] [place], [place] [address] at 8:00 AM (This time is 1 hour(s) before your procedure to ensure your preparation).   Free valet parking service is available. You will check in at ADMITTING.   *Please Note: You will receive a call the day before your procedure to confirm the appointment time. That time may have changed from the original time based on the schedule for that day.*   DIET:  Nothing to eat or drink after midnight except a sip of water with medications (see medication instructions below)  MEDICATION INSTRUCTIONS: !!IF ANY NEW MEDICATIONS ARE STARTED AFTER TODAY, PLEASE NOTIFY YOUR PROVIDER AS SOON AS POSSIBLE!!  FYI: Medications such as Semaglutide (Ozempic, [place]), Tirzepatide (Mounjaro, Zepbound), Dulaglutide (Trulicity), etc (GLP1 agonists) AND Canagliflozin (Invokana), Dapagliflozin (Farxiga), Empagliflozin  (Jardiance), Ertugliflozin (Steglatro), Bexagliflozin [employer]) or any combination with one of these drugs such as Invokamet (Canagliflozin/Metformin), Synjardy (Empagliflozin/Metformin), etc (SGLT2 inhibitors) must be held around the time of a procedure. This is not a comprehensive list of all of these drugs. Please review all of your medications and talk to your provider if you take any one of these. If you are not sure, ask your provider.        :1}Continue taking your anticoagulant (blood thinner): Apixaban  (Eliquis ).  You will need to continue this after your procedure until you are told by your provider that it is safe to stop.    LABS: will need to be drawn prior to 09/09/23.  FYI:  For your safety, and to allow us  to monitor your vital signs accurately during the surgery/procedure we request: If you have artificial nails, gel coating, SNS etc, please have those removed prior to your surgery/procedure. Not having the nail coverings /polish removed may result in cancellation or delay of your surgery/procedure.  Your support person will be asked to wait in the waiting room during your procedure.  It is OK to have someone drop you off and come back when you are ready to be discharged.  You cannot drive after the procedure and will need someone to drive you home.  Bring your insurance cards.  *Special Note: Every effort is made to have your procedure done on time. Occasionally there are emergencies that occur at the hospital that may cause delays. Please be patient if a delay does occur.

## 2023-08-20 LAB — CBC
Hematocrit: 46.3 % (ref 37.5–51.0)
Hemoglobin: 15.1 g/dL (ref 13.0–17.7)
MCH: 31.8 pg (ref 26.6–33.0)
MCHC: 32.6 g/dL (ref 31.5–35.7)
MCV: 98 fL — ABNORMAL HIGH (ref 79–97)
Platelets: 211 x10E3/uL (ref 150–450)
RBC: 4.75 x10E6/uL (ref 4.14–5.80)
RDW: 11.7 % (ref 11.6–15.4)
WBC: 6.7 x10E3/uL (ref 3.4–10.8)

## 2023-08-20 LAB — BASIC METABOLIC PANEL WITH GFR
BUN/Creatinine Ratio: 17 (ref 10–24)
BUN: 22 mg/dL (ref 8–27)
CO2: 24 mmol/L (ref 20–29)
Calcium: 9.2 mg/dL (ref 8.6–10.2)
Chloride: 103 mmol/L (ref 96–106)
Creatinine, Ser: 1.32 mg/dL — AB (ref 0.76–1.27)
Glucose: 88 mg/dL (ref 70–99)
Potassium: 4.9 mmol/L (ref 3.5–5.2)
Sodium: 141 mmol/L (ref 134–144)
eGFR: 57 mL/min/1.73 — AB (ref >59)

## 2023-08-26 ENCOUNTER — Telehealth: Payer: Self-pay

## 2023-08-26 NOTE — Telephone Encounter (Signed)
 Left message for patient to call back to discuss holding study drug and starting eliquis  prior to his cardioversion.

## 2023-08-26 NOTE — Telephone Encounter (Signed)
 Patient will need TEE with cardioversion per Dr. Inocencio.

## 2023-08-27 ENCOUNTER — Telehealth: Payer: Self-pay | Admitting: Cardiology

## 2023-08-27 NOTE — Telephone Encounter (Signed)
 Patient identification verified by 2 forms.   Pt wanting should he still be taking Flecainide . The medication was d/c on 8/5. However I do not see it in provider's notes. Will reach out to provider to clarify.  Pt verbalized understanding and will wait for return call.

## 2023-08-27 NOTE — Telephone Encounter (Signed)
 Pt c/o medication issue:  1. Name of Medication: flecainide  100mg  1 tablie two times daily   2. How are you currently taking this medication (dosage and times per day)?    3. Are you having a reaction (difficulty breathing--STAT)? no  4. What is your medication issue? Calling to see if this patient should be taking this medication. Please advise

## 2023-08-27 NOTE — Telephone Encounter (Signed)
 Left message for patient to call back

## 2023-08-28 MED ORDER — APIXABAN 5 MG PO TABS: 5.0000 mg | ORAL_TABLET | Freq: Two times a day (BID) | ORAL | 3 refills | Status: DC

## 2023-08-28 NOTE — Telephone Encounter (Signed)
 Spoke with the patient and advised that he will need to hold study drug and start on Eliquis  5 mg twice daily for his cardioversion. We will let him know when he is safe to switch back. TEE has been added on to his cardioversion on 8/26

## 2023-08-28 NOTE — Telephone Encounter (Signed)
 Called patient's wife DPR with Dr. Inocencio response. While reviewing medication list, external pharmacy added verapamil  180 mg that was reported on 08/07/23. Patient's wife thinks patient has been taking this medication. Will send to Dr. Inocencio to make sure it is okay for patient to be on this medication.

## 2023-08-28 NOTE — Telephone Encounter (Signed)
 Inocencio Soyla Lunger, MD to Me   08/28/23  4:35 PM Ok to continue verapamil   Called patient's wife to let her know Dr. Inocencio advisement. Patient's wife verbalized understanding.

## 2023-09-01 ENCOUNTER — Other Ambulatory Visit: Payer: Self-pay

## 2023-09-01 ENCOUNTER — Ambulatory Visit (HOSPITAL_BASED_OUTPATIENT_CLINIC_OR_DEPARTMENT_OTHER): Attending: Cardiology | Admitting: Cardiology

## 2023-09-01 VITALS — Ht 71.0 in | Wt 220.0 lb

## 2023-09-01 DIAGNOSIS — I480 Paroxysmal atrial fibrillation: Secondary | ICD-10-CM | POA: Diagnosis present

## 2023-09-01 DIAGNOSIS — G4733 Obstructive sleep apnea (adult) (pediatric): Secondary | ICD-10-CM

## 2023-09-01 DIAGNOSIS — I10 Essential (primary) hypertension: Secondary | ICD-10-CM | POA: Insufficient documentation

## 2023-09-03 NOTE — Progress Notes (Signed)
 Sleep Study 09/03/23: Moderate OSA and recommend CPAP

## 2023-09-03 NOTE — Procedures (Signed)
 Indications for Polysomnography The patient is a 73 year old Male who is 5' 11 and weighs 220.0 lbs.  His BMI equals 30.8.  A diagnostic polysomnogram was performed to evaluate for Obstructive Sleep Apnae.  After 132.0 minutes of sleep time the patient exhibited sufficient respiratory  events qualifying him for a CPAP trial which was then initiated.  Patient reported taking his medications at 7:30 pmApixabanEliquis Polysomnogram Data A full night polysomnogram was performed recording the standard physiologic parameters including EEG, EOG, EMG, EKG, nasal and oral airflow.  Respiratory parameters of chest and abdominal movements are recorded with Piezo-Crystal motion transducers.   Oxygen saturation was recorded by pulse oximetry.  Sleep Architecture The total recording time of the diagnostic portion of the study was 242.7 minutes.  The total sleep time was 132.0 minutes.  During the diagnostic portion of the study, the patient spent 31.4% of total sleep time in Stage N1, 59.5% in Stage N2, 0.0% in  Stages N3, and 9.1% in REM.   Sleep latency was 36.2 minutes.  REM latency was 147.5 minutes.  Sleep Efficiency was 54.4%.  Wake after Sleep Onset time was 74.5 minutes.  At 01:47:16 AM the patient was placed on PAP treatment and was titrated at pressures ranging from 6 cm/H20 up to 13 cm/H20.  The total recording time of the treatment portion of the study was 210.9 minutes.  The total sleep time was 83.0 minutes.  During  the treatment portion of the study, the patient spent 22.9% of total sleep time in Stage N1, 48.8% in Stage N2, 0.0% in Stages N3, and 28.3% in REM.   Sleep latency was 115.5 minutes.  REM latency was 15.0 minutes.  Sleep Efficiency was 39.3%.  Wake  after Sleep Onset time was 12.0 minutes.  Respiratory Events During the diagnostic portion of the study, the polysomnogram revealed a presence of 3 obstructive, 3 centrals, and 0 mixed apneas resulting in an Apnea index of 2.7 events  per hour.  There were 67 hypopneas (GreaterEqual to3% desaturation and/or  arousal) resulting in an Apnea\Hypopnea Index (AHI GreaterEqual to3% desaturation and/or arousal) of 33.2 events per hour.  There were 25 hypopneas (GreaterEqual to4% desaturation) resulting in an Apnea\Hypopnea Index (AHI GreaterEqual to4% desaturation)  of 14.1 events per hour.  There were 35 Respiratory Effort Related Arousals resulting in a RERA index of 15.9 events per hour. The Respiratory Disturbance Index is 49.1 events per hour.  The snore index was 0 events per hour.  Mean oxygen saturation was  92.7%.  The lowest oxygen saturation during sleep was 87.0%.  Time spent LessEqual to88% oxygen saturation was  minutes ().  During the treatment portion of the study, the polysomnogram revealed a presence of 2 obstructive, 26 central, and 3 mixed apneas resulting in an Apnea index of 22.4 events per hour.  There were 37 hypopneas (GreaterEqual to3% desaturation and/or  arousal) resulting in an Apnea\Hypopnea Index (AHI GreaterEqual to3% desaturation and/or arousal) of 49.2 events per hour.  There were 25 hypopneas (GreaterEqual to4% desaturation) resulting in an Apnea\Hypopnea Index (AHI GreaterEqual to4% desaturation)  of 40.5 events per hour.  There were 6 Respiratory Effort Related Arousals resulting in a RERA index of 4.3 events per hour. The Respiratory Disturbance Index is 53.5 events per hour.  The snore index was 0 events per hour.  Mean oxygen saturation was  92.9%.  The lowest oxygen saturation during sleep was 89.0%.  Time spent LessEqual to88% oxygen saturation was 0 minutes.  Limb Activity During the  diagnostic portion of the study, there were 0 limb movements recorded.  During the treatment portion of the study, there were 0 limb movements recorded.  Cardiac Summary During the diagnostic portion of the study, the average pulse rate was 56.7 bpm.  The minimum pulse rate was 46.0 bpm while the maximum pulse rate  was 71.0 bpm. Atrial Fibrillation was present.  During the treatment portion of the study, the average pulse rate was 56.5 bpm.  The minimum pulse rate was 46.0 bpm while the maximum pulse rate was 76.0 bpm. Atrial Fibrillation was present.   Diagnosis: Moderate Obstructive Sleep Apnea Atrial Fibrillation  Recommendations: 1. Recommend a full night CPAP titration 2. Healthy sleep recommendations include:  adequate nightly sleep (normal 7-9 hrs/night), avoidance of caffeine after noon and alcohol near bedtime, and maintaining a sleep environment that is cool, dark and quiet. 3. Weight loss for overweight patients is recommended.  Even modest amounts of weight loss can significantly improve the severity of sleep apnea 4. Snoring recommendations include:  weight loss where appropriate, side sleeping, and avoidance of alcohol before bed. 5. Operation of motor vehicle should be avoided when sleepy.  This study was personally reviewed and electronically signed by: Dr. Wilbert Bihari Accredited Board Certified in Sleep Medicine Date/Time: 09/03/2023 11:31AM

## 2023-09-08 NOTE — Progress Notes (Signed)
 Pt called for pre procedure instructions. Arrival time 0700 NPO after midnight explained Instructed to take am meds with sip of water and confirmed blood thinner consistency Instructed pt need for ride home tomorrow and have responsible adult with them for 24 hrs post procedure.

## 2023-09-09 ENCOUNTER — Encounter (HOSPITAL_COMMUNITY)
Admission: RE | Disposition: A | Discharge status: Home / Self Care | Payer: Self-pay | Source: Home / Self Care | Attending: Internal Medicine

## 2023-09-09 ENCOUNTER — Other Ambulatory Visit: Payer: Self-pay

## 2023-09-09 ENCOUNTER — Ambulatory Visit (HOSPITAL_COMMUNITY): Admitting: Anesthesiology

## 2023-09-09 ENCOUNTER — Ambulatory Visit (HOSPITAL_BASED_OUTPATIENT_CLINIC_OR_DEPARTMENT_OTHER)

## 2023-09-09 ENCOUNTER — Ambulatory Visit (HOSPITAL_COMMUNITY)
Admission: RE | Admit: 2023-09-09 | Discharge: 2023-09-09 | Disposition: A | Discharge status: Home / Self Care | Attending: Internal Medicine | Admitting: Internal Medicine

## 2023-09-09 DIAGNOSIS — Z7901 Long term (current) use of anticoagulants: Secondary | ICD-10-CM | POA: Diagnosis not present

## 2023-09-09 DIAGNOSIS — I517 Cardiomegaly: Secondary | ICD-10-CM

## 2023-09-09 DIAGNOSIS — D6869 Other thrombophilia: Secondary | ICD-10-CM | POA: Diagnosis not present

## 2023-09-09 DIAGNOSIS — I10 Essential (primary) hypertension: Secondary | ICD-10-CM

## 2023-09-09 DIAGNOSIS — I4891 Unspecified atrial fibrillation: Secondary | ICD-10-CM | POA: Diagnosis not present

## 2023-09-09 DIAGNOSIS — Z79899 Other long term (current) drug therapy: Secondary | ICD-10-CM | POA: Diagnosis not present

## 2023-09-09 DIAGNOSIS — I4819 Other persistent atrial fibrillation: Secondary | ICD-10-CM | POA: Insufficient documentation

## 2023-09-09 DIAGNOSIS — Z8673 Personal history of transient ischemic attack (TIA), and cerebral infarction without residual deficits: Secondary | ICD-10-CM | POA: Insufficient documentation

## 2023-09-09 DIAGNOSIS — Z01818 Encounter for other preprocedural examination: Secondary | ICD-10-CM

## 2023-09-09 HISTORY — PX: TRANSESOPHAGEAL ECHOCARDIOGRAM (CATH LAB): EP1270

## 2023-09-09 HISTORY — PX: CARDIOVERSION: EP1203

## 2023-09-09 LAB — ECHO TEE

## 2023-09-09 SURGERY — CARDIOVERSION (CATH LAB)
Anesthesia: Monitor Anesthesia Care

## 2023-09-09 MED ORDER — PROPOFOL 500 MG/50ML IV EMUL
INTRAVENOUS | Status: DC | PRN
  Administered 2023-09-09: 125 ug/kg/min via INTRAVENOUS

## 2023-09-09 MED ORDER — PROPOFOL 10 MG/ML IV BOLUS
INTRAVENOUS | Status: DC | PRN
  Administered 2023-09-09: 50 mg via INTRAVENOUS

## 2023-09-09 MED ORDER — SODIUM CHLORIDE 0.9 % IV SOLN: INTRAVENOUS | Status: DC

## 2023-09-09 SURGICAL SUPPLY — 1 items: PAD DEFIB RADIO PHYSIO CONN (PAD) ×2 IMPLANT

## 2023-09-09 NOTE — CV Procedure (Signed)
 TEE/CARDIOVERSION NOTE  TRANSESOPHAGEAL ECHOCARDIOGRAM (TEE):  Indictation: Atrial Fibrillation  Consent:   Informed consent was obtained prior to the procedure. The risks, benefits and alternatives for the procedure were discussed and the patient comprehended these risks.  Risks include, but are not limited to, cough, sore throat, vomiting, nausea, somnolence, esophageal and stomach trauma or perforation, bleeding, low blood pressure, aspiration, pneumonia, infection, trauma to the teeth and death.    Time Out: Verified patient identification, verified procedure, site/side was marked, verified correct patient position, special equipment/implants available, medications/allergies/relevent history reviewed, required imaging and test results available. Performed  Procedure:  After a procedural time-out, the patient was given propofol  per anesthesia for sedation. See their separate report for details. The patient's heart rate, blood pressure, and oxygen saturation are monitored continuously during the procedure. The oropharynx was anesthetized with topical cetacaine.  The transesophageal probe was inserted in the esophagus and stomach without difficulty and multiple views were obtained. Agitated microbubble saline contrast was not administered.  Complications:    Complications: None Patient did tolerate procedure well.  Findings:  LEFT VENTRICLE: The left ventricular wall thickness is mildly increased.  The left ventricular cavity is normal in size. Wall motion is normal.  LVEF is 55-60%.  RIGHT VENTRICLE:  The right ventricle is normal in structure and function without any thrombus or masses.    LEFT ATRIUM:  The left atrium is mildly dilated in size without any thrombus or masses.  There is not spontaneous echo contrast (smoke) in the left atrium consistent with a low flow state.  LEFT ATRIAL APPENDAGE:  The left atrial appendage is free of any thrombus or masses. The appendage  has single lobes. Pulse doppler indicates low flow in the appendage.  ATRIAL SEPTUM:  The atrial septum appears intact and is free of thrombus and/or masses.  There is no evidence for interatrial shunting by color doppler and saline microbubble.  RIGHT ATRIUM:  The right atrium is mildly dilated in size and function without any thrombus or masses.  MITRAL VALVE:  The mitral valve is normal in structure and function with trivial regurgitation.  There were no vegetations or stenosis.  AORTIC VALVE:  The aortic valve is trileaflet, normal in structure and function with no regurgitation.  There were no vegetations or stenosis  TRICUSPID VALVE:  The tricuspid valve is normal in structure and function with trivial regurgitation.  There were no vegetations or stenosis   PULMONIC VALVE:  The pulmonic valve is normal in structure and function with no regurgitation.  There were no vegetations or stenosis.   AORTIC ARCH, ASCENDING AND DESCENDING AORTA:  There was no Shaune et. Al, 1992) atherosclerosis of the ascending aorta, aortic arch, or proximal descending aorta.  12. PULMONARY VEINS: Anomalous pulmonary venous return was not noted.  13. PERICARDIUM: The pericardium appeared normal and non-thickened.  There is no pericardial effusion.  CARDIOVERSION:     Second Time Out: Verified patient identification, verified procedure, site/side was marked, verified correct patient position, special equipment/implants available, medications/allergies/relevent history reviewed, required imaging and test results available.  Performed  Procedure:  Patient placed on cardiac monitor, pulse oximetry, supplemental oxygen as necessary.  Sedation administered per anesthesia Pacer pads placed anterior and posterior chest. Cardioverted 2 time(s).  Cardioverted at 200J and 300J biphasic.  Complications:  Complications: None Patient did tolerate procedure well.  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  Time Spent Directly with the Patient:  60 minutes   Steve KYM Maxcy, MD, [facility], FNLA, FACP  [place]  [facility]  Medical Director of the [facility]  [facility] Diplomate of the American Board of Clinical Lipidology Attending Cardiologist  Direct Dial: [phone]  Fax: [phone]  Website:  [email or web address] Steve Murphy 09/09/2023, 9:37 AM

## 2023-09-09 NOTE — Interval H&P Note (Signed)
 History and Physical Interval Note:  09/09/2023 9:01 AM  Steve Murphy  has presented today for surgery, with the diagnosis of AFIB.  The various methods of treatment have been discussed with the patient and family. After consideration of risks, benefits and other options for treatment, the patient has consented to  Procedure(s): CARDIOVERSION (N/A) TRANSESOPHAGEAL ECHOCARDIOGRAM (N/A) as a surgical intervention.  The patient's history has been reviewed, patient examined, no change in status, stable for surgery.  I have reviewed the patient's chart and labs.  Questions were answered to the patient's satisfaction.     [name]

## 2023-09-09 NOTE — Progress Notes (Signed)
 Echocardiogram Echocardiogram Transesophageal has been performed.  Steve Murphy 09/09/2023, 10:32 AM

## 2023-09-09 NOTE — Transfer of Care (Signed)
 Immediate Anesthesia Transfer of Care Note  Patient: Steve Murphy  Procedure(s) Performed: CARDIOVERSION TRANSESOPHAGEAL ECHOCARDIOGRAM  Patient Location: PACU  Anesthesia Type:MAC  Level of Consciousness: awake, alert , and oriented  Airway & Oxygen Therapy: Patient Spontanous Breathing and Patient connected to nasal cannula oxygen  Post-op Assessment: Report given to RN and Post -op Vital signs reviewed and stable  Post vital signs: Reviewed and stable  Last Vitals:  Vitals Value Taken Time  BP 112/68 09/09/23 09:36  Temp    Pulse 45 09/09/23 09:38  Resp 13 09/09/23 09:38  SpO2 95 % 09/09/23 09:38  Vitals shown include unfiled device data.  Last Pain:  Vitals:   09/09/23 0936  TempSrc:   PainSc: 0-No pain         Complications: No notable events documented.

## 2023-09-09 NOTE — Anesthesia Postprocedure Evaluation (Signed)
 Anesthesia Post Note  Patient: Steve Murphy  Procedure(s) Performed: CARDIOVERSION TRANSESOPHAGEAL ECHOCARDIOGRAM     Patient location during evaluation: Cath Lab Anesthesia Type: MAC Level of consciousness: awake Pain management: pain level controlled Vital Signs Assessment: post-procedure vital signs reviewed and stable Respiratory status: spontaneous breathing, nonlabored ventilation and respiratory function stable Cardiovascular status: blood pressure returned to baseline and stable Postop Assessment: no apparent nausea or vomiting Anesthetic complications: no   No notable events documented.  Last Vitals:  Vitals:   09/09/23 1000 09/09/23 1015  BP: 119/66 110/65  Pulse: (!) 44 (!) 48  Resp: 16 16  Temp:    SpO2: 97% 98%    Last Pain:  Vitals:   09/09/23 1015  TempSrc:   PainSc: 0-No pain                 Christo Hain P Joanthony Hamza

## 2023-09-09 NOTE — Anesthesia Preprocedure Evaluation (Addendum)
 Anesthesia Evaluation  Patient identified by MRN, date of birth, ID band Patient awake    Reviewed: Allergy & Precautions, NPO status , Patient's Chart, lab work & pertinent test results  Airway Mallampati: III  TM Distance: >3 FB Neck ROM: Full    Dental  (+) Chipped,    Pulmonary    Pulmonary exam normal        Cardiovascular hypertension, Pt. on home beta blockers and Pt. on medications Normal cardiovascular exam+ dysrhythmias Atrial Fibrillation      Neuro/Psych CVA    GI/Hepatic   Endo/Other    Renal/GU      Musculoskeletal   Abdominal  (+) + obese  Peds  Hematology  (+) Blood dyscrasia (Eliquis )   Anesthesia Other Findings A-FIB  Reproductive/Obstetrics                              Anesthesia Physical Anesthesia Plan  ASA: 3  Anesthesia Plan: MAC   Post-op Pain Management:    Induction:   PONV Risk Score and Plan: 1 and Propofol  infusion and Treatment may vary due to age or medical condition  Airway Management Planned: Nasal Cannula  Additional Equipment:   Intra-op Plan:   Post-operative Plan:   Informed Consent: I have reviewed the patients History and Physical, chart, labs and discussed the procedure including the risks, benefits and alternatives for the proposed anesthesia with the patient or authorized representative who has indicated his/her understanding and acceptance.     Dental advisory given  Plan Discussed with: CRNA  Anesthesia Plan Comments:          Anesthesia Quick Evaluation

## 2023-09-10 ENCOUNTER — Encounter (HOSPITAL_COMMUNITY): Payer: Self-pay | Admitting: Internal Medicine

## 2023-09-18 ENCOUNTER — Other Ambulatory Visit: Payer: Self-pay | Admitting: Cardiology

## 2023-09-18 DIAGNOSIS — I10 Essential (primary) hypertension: Secondary | ICD-10-CM

## 2023-09-18 DIAGNOSIS — I480 Paroxysmal atrial fibrillation: Secondary | ICD-10-CM

## 2023-09-19 ENCOUNTER — Other Ambulatory Visit (HOSPITAL_COMMUNITY): Payer: Self-pay

## 2023-09-19 MED ORDER — LOSARTAN POTASSIUM 25 MG PO TABS
25.0000 mg | ORAL_TABLET | Freq: Every evening | ORAL | 3 refills | Status: AC
  Filled 2023-09-19: qty 90, 90d supply, fill #0
  Filled 2023-12-17: qty 90, 90d supply, fill #1

## 2023-09-19 MED ORDER — METOPROLOL TARTRATE 25 MG PO TABS
25.0000 mg | ORAL_TABLET | Freq: Two times a day (BID) | ORAL | 3 refills | Status: AC
  Filled 2023-09-19: qty 180, 90d supply, fill #0
  Filled 2023-12-17: qty 180, 90d supply, fill #1

## 2023-09-26 ENCOUNTER — Ambulatory Visit: Admitting: Cardiology

## 2023-09-29 ENCOUNTER — Telehealth: Payer: Self-pay | Admitting: *Deleted

## 2023-09-29 NOTE — Telephone Encounter (Signed)
 Holding ablation date for 11/6.  The office will be in touch with instructions. Wife verbalized understanding and agreeable to plan.

## 2023-09-30 ENCOUNTER — Telehealth: Payer: Self-pay | Admitting: *Deleted

## 2023-09-30 DIAGNOSIS — G4733 Obstructive sleep apnea (adult) (pediatric): Secondary | ICD-10-CM

## 2023-09-30 DIAGNOSIS — I10 Essential (primary) hypertension: Secondary | ICD-10-CM

## 2023-09-30 NOTE — Telephone Encounter (Signed)
The patient has been notified of the result. Left detailed message on voicemail and informed patient to call back.

## 2023-09-30 NOTE — Telephone Encounter (Signed)
-----   Message from Wilbert Bihari sent at 09/03/2023 11:33 AM EDT ----- Please let patient know that they have sleep apnea.  He could not be adequately titrated on CPAP due to difficulty getting to sleep.  Recommend repeat full night therapeutic CPAP titration for treatment of patient's sleep disordered breathing with a sleep aide.  Please order Lunesta 2mg  (disp# 1 tablet with 0 refills) to take at the sleep lab

## 2023-10-01 NOTE — Telephone Encounter (Signed)
 Called patient back about message. Patient stated he has been waiting to hear back about ablation date. Will send message to nurse how has been talking to patient about that.

## 2023-10-01 NOTE — Telephone Encounter (Signed)
 Patient is returning call.

## 2023-10-01 NOTE — Telephone Encounter (Signed)
 Spoke to pt's wife, pt still at work. Aware I spoke to Dr. Inocencio yesterday and will be speaking with pharmacist soon. Informed that I would follow up soon, after speaking with pharmD, and let them know plan. Also made aware that 11/6 date for ablation is still held. Wife appreciates the return call/information.

## 2023-10-07 NOTE — Telephone Encounter (Signed)
 The patient has been notified of the result and verbalized understanding.  All questions (if any) were answered. Joshua Dalton Seip, CMA 10/07/2023 8:53 AM    The patient has declined to go back and retest. He says he did not like that night at all. He is standing firm.

## 2023-10-09 NOTE — Addendum Note (Signed)
 Addended by: JOSHUA DALTON MATSU on: 10/09/2023 03:48 PM   Modules accepted: Orders

## 2023-10-09 NOTE — Telephone Encounter (Signed)
 Per Dr Shlomo, Order auto CPAP from 4 to 20 cm H2O with heated humidity and mask of choice   Upon patient request DME selection is [facility] set up his cpap. Patient understands to call if [facility] does not contact him with new setup in a timely manner. Patient understands they will be called once confirmation has been received from [facility] that they have received their new machine to schedule 10 week follow up appointment.   [facility] notified of new cpap order  Please add to airview Patient was grateful for the call and thanked me.

## 2023-10-13 ENCOUNTER — Other Ambulatory Visit (HOSPITAL_COMMUNITY): Payer: Self-pay | Admitting: Pharmacist

## 2023-10-16 ENCOUNTER — Other Ambulatory Visit (HOSPITAL_COMMUNITY): Payer: Self-pay

## 2023-10-17 ENCOUNTER — Other Ambulatory Visit (HOSPITAL_COMMUNITY): Payer: Self-pay

## 2023-10-17 ENCOUNTER — Encounter: Payer: Self-pay | Admitting: Cardiology

## 2023-10-17 ENCOUNTER — Other Ambulatory Visit: Payer: Self-pay

## 2023-10-17 MED ORDER — APIXABAN 5 MG PO TABS
5.0000 mg | ORAL_TABLET | Freq: Two times a day (BID) | ORAL | 1 refills | Status: AC
  Filled 2023-10-17: qty 60, 30d supply, fill #0
  Filled 2023-11-13: qty 60, 30d supply, fill #1
  Filled 2023-12-17: qty 60, 30d supply, fill #2
  Filled 2024-01-13: qty 60, 30d supply, fill #3

## 2023-10-17 NOTE — Telephone Encounter (Signed)
 Prescription refill request for Eliquis  received. Indication:afib Last office visit:8/25 Scr:1.32  8/25 Age: 73 Weight:99.8  kg  Prescription refilled

## 2023-10-21 ENCOUNTER — Other Ambulatory Visit: Payer: Self-pay

## 2023-10-21 ENCOUNTER — Telehealth: Payer: Self-pay

## 2023-10-21 DIAGNOSIS — I480 Paroxysmal atrial fibrillation: Secondary | ICD-10-CM

## 2023-10-21 NOTE — Telephone Encounter (Signed)
Work up complete. 

## 2023-10-21 NOTE — Telephone Encounter (Signed)
-----   Message from Nurse Sherri P sent at 10/06/2023 10:24 AM EDT ----- Regarding: 11/06  AFib ablation 11/6 Precert:  MD: Camnitz Type of ablation: A-fib Diagnosis: tachycardia CPT code: A-fib (06343) Ablation scheduled (date/time): 11/06 12:30 pm  Procedure:  Added to calendar? Yes Orders entered? Yes Letter complete? No, >30 days before procedure Scheduled with cath lab? Yes Any medications to hold? No Labs ordered (CBC, BMET, PT/INR if on warfarin): Yes Mapping system: CARTO (lab 4 or 6) CARTO/OPAL rep notified? No Cardiac CT needed? No Dye allergy? N/a Pre-meds ordered and instructions given? N/a Letter method: MyChart H&P: 8/5 Device: No  Follow-up:  Cassie/Angel, please schedule Routine.

## 2023-10-31 ENCOUNTER — Ambulatory Visit: Admitting: Cardiology

## 2023-10-31 ENCOUNTER — Telehealth (HOSPITAL_COMMUNITY): Payer: Self-pay

## 2023-10-31 NOTE — Telephone Encounter (Signed)
 Attempted to reach patient to discuss upcoming procedure. Advised patient is not available.

## 2023-11-01 LAB — BASIC METABOLIC PANEL WITH GFR
BUN/Creatinine Ratio: 14 (ref 10–24)
BUN: 23 mg/dL (ref 8–27)
CO2: 20 mmol/L (ref 20–29)
Calcium: 8.7 mg/dL (ref 8.6–10.2)
Chloride: 106 mmol/L (ref 96–106)
Creatinine, Ser: 1.65 mg/dL — AB (ref 0.76–1.27)
Glucose: 98 mg/dL (ref 70–99)
Potassium: 4.6 mmol/L (ref 3.5–5.2)
Sodium: 142 mmol/L (ref 134–144)
eGFR: 43 mL/min/1.73 — AB (ref >59)

## 2023-11-01 LAB — CBC
Hematocrit: 41.5 % (ref 37.5–51.0)
Hemoglobin: 13.7 g/dL (ref 13.0–17.7)
MCH: 32.6 pg (ref 26.6–33.0)
MCHC: 33 g/dL (ref 31.5–35.7)
MCV: 99 fL — ABNORMAL HIGH (ref 79–97)
Platelets: 208 x10E3/uL (ref 150–450)
RBC: 4.2 x10E6/uL (ref 4.14–5.80)
RDW: 12.7 % (ref 11.6–15.4)
WBC: 6.8 x10E3/uL (ref 3.4–10.8)

## 2023-11-03 NOTE — Progress Notes (Unsigned)
 Cardiology Office Note   Date:  11/04/2023  ID:  Steve Murphy, DOB [birth year], MRN [id number] PCP: Barbra Odor, NP  [facility] Providers Cardiologist:  Gordy Bergamo, MD Electrophysiologist:  Will Gladis Norton, MD     History of Present Illness Steve Murphy is a 73 y.o. male with a past medical history of stroke, atrial fibrillation, hypertension.  09/09/2023 TEE EF 55 to 60%, mild concentric LVH 09/09/2023 DCCV >> successful  07/21/2023 ETT normal, DCCV x 3, unsuccessful  02/27/2023 DCCV >> successful  Initially diagnosed with atrial fibrillation around October 2024 in the perioperative process for upcoming orthopedic surgery.  Most recently evaluated by Dr. Norton on 08/19/2023, underwent cardioversion 02/27/2023 >> cardioversion again on 07/21/2023 x 3 which was unsuccessful--he had failed flecainide  therapy, he continued to be symptomatic and the decision was made to load him with amiodarone  and repeat cardioversion which was ultimately successful on 09/09/2023 with ultimate goal of an ablation, which is tentatively scheduled for 11/20/2023.  He presents today for follow up, he is in atrial fibrillation today but his rate is controlled.  He is somewhat stoic, hard to get an answer for him on how he is feeling, he just states he does not feel good.  He mentions he had an episode of chest pain that occurred approximately a month ago when he was working in his barn however he cannot provide me any further details about accompanying symptoms and just simply tells me that  it occurred a month ago and he cannot recall what he had for breakfast today.  He has had some weight gain, he is attributing it to his Eliquis  and amiodarone , he has some appreciable edema however he states he is not sure if it is always like this or not, and he also states his appetite has increased and  he is essentially eating much more. He states he went into AF ~ 3 days ago. He denies  palpitations, dyspnea,  pnd, orthopnea, n, v, dizziness, syncope, weight gain, or early satiety.    ROS: Review of Systems  Constitutional:  Positive for malaise/fatigue.  Cardiovascular:  Positive for chest pain (1 episode) and leg swelling.  All other systems reviewed and are negative.    Studies Reviewed EKG Interpretation Date/Time:  Tuesday November 04 2023 10:54:46 EDT Ventricular Rate:  59 PR Interval:    QRS Duration:  88 QT Interval:  430 QTC Calculation: 425 R Axis:   70  Text Interpretation: Atrial fibrillation with slow ventricular response When compared with ECG of 09-Sep-2023 09:45, Atrial fibrillation has replaced Sinus rhythm Confirmed by Carlin Nest [phone]) on 11/04/2023 10:59:32 AM    Cardiac Studies & Procedures   ______________________________________________________________________________________________   STRESS TESTS  EXERCISE TOLERANCE TEST (ETT) 07/16/2023  Interpretation Summary   Exercise treadmill:  Clinically negative   There were no EKG changes for HR achieved but the test is nondiagnostic as the pt only exercised 1 min 27 seconds to a peak HR of 93 bpm  Stopped because of fatigue   Very  poor exercise tolerance   ECHOCARDIOGRAM  ECHOCARDIOGRAM COMPLETE 10/31/2022  Narrative 3  ECHOCARDIOGRAM REPORT    Patient Name:   Steve Murphy Date of Exam: 10/31/2022 Medical Rec #:  [id number]        Height:       71.0 in Accession #:    [id number]       Weight:       219.6 lb Date of Birth:  July 31, 1950  BSA:          2.194 m Patient Age:    72 years         BP:           124/70 mmHg Patient Gender: M                HR:           85 bpm. Exam Location:  [address]  Procedure: 2D Echo, Cardiac Doppler and Color Doppler  Indications:    Z01.818 Pre-Operative Eval for Knee Replacement  History:        Patient has no prior history of Echocardiogram examinations. Arrythmias:Atrial Fibrillation and PAC, Signs/Symptoms:Dyspnea, Fatigue and  Dizziness/Lightheadedness; Risk Factors:Hypertension. Pre-Op eval for knee replacement.  Sonographer:    Heather Hawks RDCS Referring Phys: GORDY BERGAMO  IMPRESSIONS   1. Left ventricular ejection fraction, by estimation, is 50 to 55%. The left ventricle has low normal function. The left ventricle has no regional wall motion abnormalities. There is mild concentric left ventricular hypertrophy. Left ventricular diastolic parameters are indeterminate. 2. Right ventricular systolic function is normal. The right ventricular size is normal. 3. Left atrial size was mildly dilated. 4. Right atrial size was mildly dilated. 5. The mitral valve is normal in structure. Trivial mitral valve regurgitation. No evidence of mitral stenosis. 6. The aortic valve is tricuspid. There is mild calcification of the aortic valve. Aortic valve regurgitation is not visualized. No aortic stenosis is present. 7. Aortic dilatation noted. There is mild dilatation of the ascending aorta, measuring 39 mm. 8. The patient was in atrial fibrillation. 9. The inferior vena cava is normal in size with greater than 50% respiratory variability, suggesting right atrial pressure of 3 mmHg.  FINDINGS Left Ventricle: Left ventricular ejection fraction, by estimation, is 50 to 55%. The left ventricle has low normal function. The left ventricle has no regional wall motion abnormalities. The left ventricular internal cavity size was normal in size. There is mild concentric left ventricular hypertrophy. Left ventricular diastolic parameters are indeterminate.  Right Ventricle: The right ventricular size is normal. No increase in right ventricular wall thickness. Right ventricular systolic function is normal.  Left Atrium: Left atrial size was mildly dilated.  Right Atrium: Right atrial size was mildly dilated.  Pericardium: There is no evidence of pericardial effusion.  Mitral Valve: The mitral valve is normal in structure. Trivial  mitral valve regurgitation. No evidence of mitral valve stenosis.  Tricuspid Valve: The tricuspid valve is normal in structure. Tricuspid valve regurgitation is trivial.  Aortic Valve: The aortic valve is tricuspid. There is mild calcification of the aortic valve. Aortic valve regurgitation is not visualized. No aortic stenosis is present.  Pulmonic Valve: The pulmonic valve was normal in structure. Pulmonic valve regurgitation is not visualized.  Aorta: Aortic dilatation noted. There is mild dilatation of the ascending aorta, measuring 39 mm.  Venous: The inferior vena cava is normal in size with greater than 50% respiratory variability, suggesting right atrial pressure of 3 mmHg.  IAS/Shunts: No atrial level shunt detected by color flow Doppler.   LEFT VENTRICLE PLAX 2D LVIDd:         4.80 cm   Diastology LVIDs:         3.00 cm   LV e' medial:    11.20 cm/s LV PW:         0.70 cm   LV E/e' medial:  7.7 LV IVS:        0.60 cm  LV e' lateral:   5.97 cm/s LVOT diam:     2.60 cm   LV E/e' lateral: 14.4 LV SV:         75 LV SV Index:   34 LVOT Area:     5.31 cm   RIGHT VENTRICLE RV Basal diam:  3.70 cm RV S prime:     12.10 cm/s TAPSE (M-mode): 1.7 cm RVSP:           21.5 mmHg  LEFT ATRIUM             Index        RIGHT ATRIUM           Index LA diam:        4.10 cm 1.87 cm/m   RA Pressure: 3.00 mmHg LA Vol (A2C):   84.2 ml 38.38 ml/m  RA Area:     22.50 cm LA Vol (A4C):   59.3 ml 27.03 ml/m  RA Volume:   69.70 ml  31.77 ml/m LA Biplane Vol: 73.3 ml 33.41 ml/m AORTIC VALVE LVOT Vmax:   83.67 cm/s LVOT Vmean:  57.267 cm/s LVOT VTI:    0.142 m  AORTA Ao Root diam: 3.60 cm Ao Asc diam:  3.90 cm  MITRAL VALVE               TRICUSPID VALVE MV Area (PHT): cm         TR Peak grad:   18.5 mmHg MV Decel Time: 119 msec    TR Vmax:        215.00 cm/s MV E velocity: 85.90 cm/s  Estimated RAP:  3.00 mmHg RVSP:           21.5 mmHg  SHUNTS Systemic VTI:  0.14 m Systemic  Diam: 2.60 cm  Dalton McleanMD Electronically signed by Ezra Kanner Signature Date/Time: 10/31/2022/5:08:40 PM    Final   TEE  ECHO TEE 09/09/2023  Narrative TRANSESOPHOGEAL ECHO REPORT    Patient Name:   ANJELO PULLMAN Date of Exam: 09/09/2023 Medical Rec #:  [id number]        Height:       71.0 in Accession #:    [id number]       Weight:       220.0 lb Date of Birth:  [birth year]        BSA:          2.196 m Patient Age:    72 years         BP:           162/93 mmHg Patient Gender: M                HR:           58 bpm. Exam Location:  Inpatient  Procedure: Transesophageal Echo, Cardiac Doppler and Color Doppler (Both Spectral and Color Flow Doppler were utilized during procedure).  Indications:     Atrial fibrillation I48.91  History:         Patient has prior history of Echocardiogram examinations, most recent 10/31/2022. Stroke, Arrythmias:Atrial Fibrillation; Risk Factors:Hypertension.  Sonographer:     Thea Norlander RCS Referring Phys:  [id number] VINIE BROCKS HILTY Diagnosing Phys: VINIE Maxcy MD  PROCEDURE: After discussion of the risks and benefits of a TEE, an informed consent was obtained from the patient. The transesophogeal probe was passed without difficulty through the esophogus of the patient. Imaged were obtained with the patient in a supine position. Sedation performed by different physician.  The patient was monitored while under deep sedation. Anesthestetic sedation was provided intravenously by Anesthesiology: 174.75mg  of Propofol , 0mg  of Lidocaine . The patient developed no complications during the procedure. A successful direct current cardioversion was performed at 300 joules with 2 attempts.  IMPRESSIONS   1. Left ventricular ejection fraction, by estimation, is 55 to 60%. The left ventricle has normal function. The left ventricle has no regional wall motion abnormalities. There is mild concentric left ventricular hypertrophy. 2. Right ventricular  systolic function is normal. The right ventricular size is normal. 3. Left atrial size was mildly dilated. No left atrial/left atrial appendage thrombus was detected. 4. Right atrial size was mildly dilated. 5. The mitral valve is grossly normal. Trivial mitral valve regurgitation. 6. The aortic valve is tricuspid. Aortic valve regurgitation is not visualized. No aortic stenosis is present. 7. Aortic dilatation noted. There is mild dilatation of the ascending aorta, measuring 40 mm.  Conclusion(s)/Recommendation(s): No LA/LAA thrombus identified. Successful cardioversion performed with restoration of normal sinus rhythm.  FINDINGS Left Ventricle: Left ventricular ejection fraction, by estimation, is 55 to 60%. The left ventricle has normal function. The left ventricle has no regional wall motion abnormalities. The left ventricular internal cavity size was normal in size. There is mild concentric left ventricular hypertrophy.  Right Ventricle: The right ventricular size is normal. No increase in right ventricular wall thickness. Right ventricular systolic function is normal.  Left Atrium: Left atrial size was mildly dilated. No left atrial/left atrial appendage thrombus was detected.  Right Atrium: Right atrial size was mildly dilated.  Pericardium: There is no evidence of pericardial effusion.  Mitral Valve: The mitral valve is grossly normal. Trivial mitral valve regurgitation.  Tricuspid Valve: The tricuspid valve is grossly normal. Tricuspid valve regurgitation is trivial.  Aortic Valve: The aortic valve is tricuspid. Aortic valve regurgitation is not visualized. No aortic stenosis is present.  Pulmonic Valve: The pulmonic valve was normal in structure. Pulmonic valve regurgitation is not visualized.  Aorta: Aortic dilatation noted. There is mild dilatation of the ascending aorta, measuring 40 mm.  IAS/Shunts: No atrial level shunt detected by color flow Doppler.  Additional  Comments: Spectral Doppler performed.  LEFT VENTRICLE PLAX 2D LVOT diam:     2.10 cm LVOT Area:     3.46 cm    AORTA Ao Root diam: 3.50 cm Ao Asc diam:  4.00 cm   SHUNTS Systemic Diam: 2.10 cm  Vinie Maxcy MD Electronically signed by Vinie Maxcy MD Signature Date/Time: 09/09/2023/12:35:59 PM    Final        ______________________________________________________________________________________________      Risk Assessment/Calculations  CHA2DS2-VASc Score = 4   This indicates a 4.8% annual risk of stroke. The patient's score is based upon: CHF History: 0 HTN History: 1 Diabetes History: 0 Stroke History: 2 Vascular Disease History: 0 Age Score: 1 Gender Score: 0     Physical Exam VS:  BP (!) 144/80 (BP Location: Left Arm, Patient Position: Sitting, Cuff Size: Normal)   Pulse (!) 59   Ht 5' 11 (1.803 m)   Wt 229 lb (103.9 kg)   SpO2 98%   BMI 31.94 kg/m        Wt Readings from Last 3 Encounters:  11/04/23 229 lb (103.9 kg)  09/01/23 220 lb (99.8 kg)  08/19/23 220 lb (99.8 kg)    GEN: Well nourished, well developed in no acute distress NECK: No JVD; No carotid bruits CARDIAC: irregularly irregular, no murmurs, rubs, gallops RESPIRATORY:  Clear to  auscultation without rales, wheezing or rhonchi  ABDOMEN: Soft, non-tender, non-distended EXTREMITIES:  +1 pitting edema; No deformity   ASSESSMENT AND PLAN Persistent atrial fibrillation/hypercoagulable state/on amiodarone -his CHA2DS2-VASc score is 4, fortunately he is in atrial fibrillation today and states he went back in atrial fibrillation approximately 3 days ago.  He is just not feeling great overall, and has not been since his atrial fibrillation started.  Ablation is scheduled for 11/20/2023. Will decrease his metoprolol  to 12.5 mg twice daily. Continue amiodarone  200 mg once daily since his ablation date is so close.  Continue Eliquis  5 mg twice daily--no indication for dose reduction age of 41,  weight is 229, tolerating without any adverse bleeding effects with most recent hemoglobin 13.7 hematocrit 41.5 on 10/31/2023.  Hypertension -he has been dealing with fluctuating blood pressure at home, his blood pressure is elevated in the office today however he states is typically well-controlled at home and mention it was 84/40 the previous evening.  Will decrease his dose of metoprolol  to 12.5 mg twice daily, continue his losartan  for now.  I did encourage him to drink more water as he said he is only drinking about 16 ounces per day.  CKD 3b -encouraged him to increase his oral hydration to at least double what he is drinking right now, repeat BMET in 1 week.  History of stroke-anticoagulated on Eliquis , does not appear he is on any lipid-lowering agents, stroke was felt to be secondary to A-fib.       Dispo: Increase water intake, repeat BMET in 1 week, decrease metoprolol  to 12.5 mg twice daily.  Follow-up with Dr. Ladona in 6 months.  Signed, Delon JAYSON Hoover, NP

## 2023-11-04 ENCOUNTER — Encounter: Payer: Self-pay | Admitting: Cardiology

## 2023-11-04 ENCOUNTER — Ambulatory Visit: Attending: Cardiology | Admitting: Cardiology

## 2023-11-04 VITALS — BP 144/80 | HR 59 | Ht 71.0 in | Wt 229.0 lb

## 2023-11-04 DIAGNOSIS — Z79899 Other long term (current) drug therapy: Secondary | ICD-10-CM | POA: Diagnosis present

## 2023-11-04 DIAGNOSIS — I10 Essential (primary) hypertension: Secondary | ICD-10-CM

## 2023-11-04 DIAGNOSIS — I480 Paroxysmal atrial fibrillation: Secondary | ICD-10-CM

## 2023-11-04 DIAGNOSIS — N1832 Chronic kidney disease, stage 3b: Secondary | ICD-10-CM | POA: Diagnosis present

## 2023-11-04 DIAGNOSIS — D6859 Other primary thrombophilia: Secondary | ICD-10-CM

## 2023-11-04 NOTE — Patient Instructions (Addendum)
 Medication Instructions:  Decrease Metoprolol  Tartrate 12.5 mg twice daily  *If you need a refill on your cardiac medications before your next appointment, please call your pharmacy*  Lab Work: BMET in 7 days  Testing/Procedures: NONE ordered at this time of appointment   Follow-Up: At [facility], you and your health needs are our priority.  As part of our continuing mission to provide you with exceptional heart care, our providers are all part of one team.  This team includes your primary Cardiologist (physician) and Advanced Practice Providers or APPs (Physician Assistants and Nurse Practitioners) who all work together to provide you with the care you need, when you need it.  Your next appointment:   6 month(s)  Provider:   Gordy Bergamo, MD    We recommend signing up for the patient portal called MyChart.  Sign up information is provided on this After Visit Summary.  MyChart is used to connect with patients for Virtual Visits (Telemedicine).  Patients are able to view lab/test results, encounter notes, upcoming appointments, etc.  Non-urgent messages can be sent to your provider as well.   To learn more about what you can do with MyChart, go to [email or web address].

## 2023-11-13 ENCOUNTER — Ambulatory Visit: Admitting: Cardiology

## 2023-11-13 ENCOUNTER — Other Ambulatory Visit (HOSPITAL_COMMUNITY): Payer: Self-pay

## 2023-11-14 ENCOUNTER — Other Ambulatory Visit: Payer: Self-pay | Admitting: Cardiology

## 2023-11-15 LAB — BASIC METABOLIC PANEL WITH GFR
BUN/Creatinine Ratio: 11 (ref 10–24)
BUN: 18 mg/dL (ref 8–27)
CO2: 19 mmol/L — ABNORMAL LOW (ref 20–29)
Calcium: 8.8 mg/dL (ref 8.6–10.2)
Chloride: 107 mmol/L — ABNORMAL HIGH (ref 96–106)
Creatinine, Ser: 1.65 mg/dL — ABNORMAL HIGH (ref 0.76–1.27)
Glucose: 81 mg/dL (ref 70–99)
Potassium: 4.4 mmol/L (ref 3.5–5.2)
Sodium: 142 mmol/L (ref 134–144)
eGFR: 43 mL/min/1.73 — ABNORMAL LOW

## 2023-11-18 ENCOUNTER — Ambulatory Visit: Payer: Self-pay | Admitting: Cardiology

## 2023-11-18 NOTE — Telephone Encounter (Signed)
 LVM asking pt to call our office to discuss lab results 1sttempt

## 2023-11-19 NOTE — Telephone Encounter (Signed)
 LVM asking pt to call our office to discuss lab results. 2nd attempt

## 2023-11-19 NOTE — Pre-Procedure Instructions (Signed)
 Instructed patient on the following items: Arrival time 0930 Nothing to eat or drink after midnight No meds AM of procedure Responsible person to drive you home and stay with you for 24 hrs  Have you missed any doses of anti-coagulant Eliquis - takes twice a day,  hasn't missed any doses.  Don't take dose morning of procedure.

## 2023-11-20 ENCOUNTER — Other Ambulatory Visit: Payer: Self-pay

## 2023-11-20 ENCOUNTER — Ambulatory Visit (HOSPITAL_COMMUNITY)
Admission: RE | Admit: 2023-11-20 | Discharge: 2023-11-20 | Disposition: A | Discharge status: Home / Self Care | Attending: Cardiology | Admitting: Cardiology

## 2023-11-20 ENCOUNTER — Ambulatory Visit (HOSPITAL_COMMUNITY): Admitting: Anesthesiology

## 2023-11-20 ENCOUNTER — Encounter (HOSPITAL_COMMUNITY): Admission: RE | Disposition: A | Payer: Self-pay | Source: Home / Self Care | Attending: Cardiology

## 2023-11-20 ENCOUNTER — Encounter (HOSPITAL_COMMUNITY): Payer: Self-pay | Admitting: Cardiology

## 2023-11-20 DIAGNOSIS — Z7901 Long term (current) use of anticoagulants: Secondary | ICD-10-CM | POA: Insufficient documentation

## 2023-11-20 DIAGNOSIS — I10 Essential (primary) hypertension: Secondary | ICD-10-CM | POA: Diagnosis not present

## 2023-11-20 DIAGNOSIS — Z79899 Other long term (current) drug therapy: Secondary | ICD-10-CM | POA: Diagnosis not present

## 2023-11-20 DIAGNOSIS — I4819 Other persistent atrial fibrillation: Secondary | ICD-10-CM | POA: Diagnosis present

## 2023-11-20 DIAGNOSIS — I4891 Unspecified atrial fibrillation: Secondary | ICD-10-CM | POA: Diagnosis not present

## 2023-11-20 HISTORY — PX: ATRIAL FIBRILLATION ABLATION: EP1191

## 2023-11-20 SURGERY — ATRIAL FIBRILLATION ABLATION
Anesthesia: General

## 2023-11-20 MED ORDER — LIDOCAINE 2% (20 MG/ML) 5 ML SYRINGE
INTRAMUSCULAR | Status: DC | PRN
  Administered 2023-11-20: 40 mg via INTRAVENOUS

## 2023-11-20 MED ORDER — DEXAMETHASONE SOD PHOSPHATE PF 10 MG/ML IJ SOLN
INTRAMUSCULAR | Status: DC | PRN
  Administered 2023-11-20: 5 mg via INTRAVENOUS

## 2023-11-20 MED ORDER — SUGAMMADEX SODIUM 200 MG/2ML IV SOLN
INTRAVENOUS | Status: DC | PRN
  Administered 2023-11-20: 400 mg via INTRAVENOUS

## 2023-11-20 MED ORDER — HEPARIN (PORCINE) IN NACL 1000-0.9 UT/500ML-% IV SOLN
INTRAVENOUS | Status: DC | PRN
  Administered 2023-11-20 (×4): 500 mL

## 2023-11-20 MED ORDER — HEPARIN SODIUM (PORCINE) 1000 UNIT/ML IJ SOLN
INTRAMUSCULAR | Status: DC | PRN
Start: 2023-11-20 — End: 2023-11-20
  Administered 2023-11-20: 1000 [IU] via INTRAVENOUS

## 2023-11-20 MED ORDER — PROTAMINE SULFATE 10 MG/ML IV SOLN
INTRAVENOUS | Status: DC | PRN
  Administered 2023-11-20: 40 mg via INTRAVENOUS

## 2023-11-20 MED ORDER — ATROPINE SULFATE 1 MG/10ML IJ SOSY
PREFILLED_SYRINGE | INTRAMUSCULAR | Status: DC | PRN
  Administered 2023-11-20: 1 mg via INTRAVENOUS

## 2023-11-20 MED ORDER — FENTANYL CITRATE (PF) 100 MCG/2ML IJ SOLN
INTRAMUSCULAR | Status: AC
  Filled 2023-11-20: qty 2

## 2023-11-20 MED ORDER — PHENYLEPHRINE HCL-NACL 20-0.9 MG/250ML-% IV SOLN
INTRAVENOUS | Status: DC | PRN
  Administered 2023-11-20: 20 ug/min via INTRAVENOUS

## 2023-11-20 MED ORDER — ATROPINE SULFATE 1 MG/10ML IJ SOSY
PREFILLED_SYRINGE | INTRAMUSCULAR | Status: AC
  Filled 2023-11-20: qty 10

## 2023-11-20 MED ORDER — ACETAMINOPHEN 500 MG PO TABS
1000.0000 mg | ORAL_TABLET | Freq: Once | ORAL | Status: AC
  Administered 2023-11-20: 1000 mg via ORAL
  Filled 2023-11-20: qty 2

## 2023-11-20 MED ORDER — HEPARIN SODIUM (PORCINE) 1000 UNIT/ML IJ SOLN
INTRAMUSCULAR | Status: AC
  Filled 2023-11-20: qty 10

## 2023-11-20 MED ORDER — SODIUM CHLORIDE 0.9 % IV SOLN: INTRAVENOUS | Status: DC

## 2023-11-20 MED ORDER — ONDANSETRON HCL 4 MG/2ML IJ SOLN
INTRAMUSCULAR | Status: DC | PRN
  Administered 2023-11-20: 4 mg via INTRAVENOUS

## 2023-11-20 MED ORDER — FENTANYL CITRATE (PF) 100 MCG/2ML IJ SOLN
INTRAMUSCULAR | Status: DC | PRN
  Administered 2023-11-20 (×2): 50 ug via INTRAVENOUS

## 2023-11-20 MED ORDER — PROPOFOL 10 MG/ML IV BOLUS
INTRAVENOUS | Status: DC | PRN
Start: 2023-11-20 — End: 2023-11-20
  Administered 2023-11-20: 150 mg via INTRAVENOUS
  Administered 2023-11-20: 20 mg via INTRAVENOUS

## 2023-11-20 MED ORDER — ROCURONIUM BROMIDE 10 MG/ML (PF) SYRINGE
PREFILLED_SYRINGE | INTRAVENOUS | Status: DC | PRN
  Administered 2023-11-20: 50 mg via INTRAVENOUS
  Administered 2023-11-20: 30 mg via INTRAVENOUS

## 2023-11-20 MED ORDER — HEPARIN SODIUM (PORCINE) 1000 UNIT/ML IJ SOLN
INTRAMUSCULAR | Status: DC | PRN
  Administered 2023-11-20: 6000 [IU] via INTRAVENOUS
  Administered 2023-11-20: 14000 [IU] via INTRAVENOUS
  Administered 2023-11-20: 8000 [IU] via INTRAVENOUS

## 2023-11-20 SURGICAL SUPPLY — 17 items
BLANKET WARM UNDERBOD FULL ACC (MISCELLANEOUS) ×2 IMPLANT
CABLE VARIPULSE STERILE (CATHETERS) IMPLANT
CATH DECANAV D CURVE (CATHETERS) IMPLANT
CATH GE 8FR SOUNDSTAR (CATHETERS) IMPLANT
CATHETER VARIPULSE 8.5FR (CATHETERS) IMPLANT
CLOSURE MYNX CONTROL 6F/7F (Vascular Products) IMPLANT
CLOSURE PERCLOSE PROSTYLE (Vascular Products) IMPLANT
COVER SWIFTLINK CONNECTOR (BAG) ×2 IMPLANT
KIT VERSACROSS 8.5F 63 45D 180 (KITS) IMPLANT
PACK EP LF (CUSTOM PROCEDURE TRAY) ×2 IMPLANT
PAD DEFIB RADIO PHYSIO CONN (PAD) ×2 IMPLANT
PATCH CARTO3 (PAD) IMPLANT
SHEATH CARTO VIZIGO SM CVD (SHEATH) IMPLANT
SHEATH PINNACLE 8F 10CM (SHEATH) IMPLANT
SHEATH PINNACLE 9F 10CM (SHEATH) IMPLANT
SHEATH PROBE COVER 6X72 (BAG) IMPLANT
TUBING SMART ABLATE COOLFLOW (TUBING) IMPLANT

## 2023-11-20 NOTE — Progress Notes (Signed)
 Discharge instructions reviewed with patient and wife at the bedside denies questions or concerns. No s/s of complications at the incision site at the time of care transfer t

## 2023-11-20 NOTE — H&P (Signed)
  Electrophysiology Office Note:   Date:  11/20/2023  ID:  Steve Murphy, DOB February 15, 1950, MRN [id number]  Primary Cardiologist: Steve Bergamo, MD Primary Heart Failure: None Electrophysiologist: Steve Murphy Gladis Norton, MD      History of Present Illness:   Steve Murphy is a 73 y.o. male with h/o atrial fibrillation seen today for  for Electrophysiology evaluation of atrial fibrillation, hypertension at the request of Steve Murphy.    Atrial fibrillation was diagnosed 11/06/2022.  He had cardioversion 02/27/2023.  Echo showed a normal ejection fraction.  He presented to cardiology clinic 06/27/2023 with fatigue and weakness.  He was found to be in atrial fibrillation and is post cardioversion 07/21/2023.  Today, denies symptoms of palpitations, chest pain, dyspnea, orthopnea, PND, lower extremity edema, claudication, dizziness, presyncope, syncope, bleeding, or neurologic sequela. The patient is tolerating medications without difficulties. Plan AF ablation today.   EP Information / Studies Reviewed:    EKG is not ordered today. EKG from 08/12/2023 reviewed which showed atrial fibrillation      Risk Assessment/Calculations:    CHA2DS2-VASc Score = 4   This indicates a 4.8% annual risk of stroke. The patient's score is based upon: CHF History: 0 HTN History: 1 Diabetes History: 0 Stroke History: 2 Vascular Disease History: 0 Age Score: 1 Gender Score: 0            Physical Exam:   VS:  There were no vitals taken for this visit.   Wt Readings from Last 3 Encounters:  11/04/23 103.9 kg  09/01/23 99.8 kg  08/19/23 99.8 kg    GEN: Well nourished, well developed in no acute distress NECK: No JVD; No carotid bruits CARDIAC: Regular rate and rhythm, no murmurs, rubs, gallops RESPIRATORY:  Clear to auscultation without rales, wheezing or rhonchi  ABDOMEN: Soft, non-tender, non-distended EXTREMITIES:  No edema; No deformity    ASSESSMENT AND PLAN:    1.  Persistent atrial  fibrillation: [name] has presented today for surgery, with the diagnosis of AF.  The various methods of treatment have been discussed with the patient and family. After consideration of risks, benefits and other options for treatment, the patient has consented to  Procedure(s): Catheter ablation as a surgical intervention .  Risks include but not limited to complete heart block, stroke, esophageal damage, nerve damage, bleeding, vascular damage, tamponade, perforation, MI, and death. The patient's history has been reviewed, patient examined, no change in status, stable for surgery.  I have reviewed the patient's chart and labs.  Questions were answered to the patient's satisfaction.    Huston Stonehocker Norton, MD 11/20/2023 9:28 AM

## 2023-11-20 NOTE — Anesthesia Procedure Notes (Signed)
 Procedure Name: Intubation Date/Time: 11/20/2023 12:11 PM  Performed by: Claudene Arlin LABOR, CRNAPre-anesthesia Checklist: Patient identified, Emergency Drugs available, Suction available and Patient being monitored Patient Re-evaluated:Patient Re-evaluated prior to induction Oxygen Delivery Method: Circle system utilized Preoxygenation: Pre-oxygenation with 100% oxygen Induction Type: IV induction Ventilation: Mask ventilation without difficulty Laryngoscope Size: Mac and 4 Grade View: Grade I Tube type: Oral Tube size: 7.0 mm Number of attempts: 1 Airway Equipment and Method: Stylet and Oral airway Placement Confirmation: ETT inserted through vocal cords under direct vision, positive ETCO2 and breath sounds checked- equal and bilateral Secured at: 22 cm Tube secured with: Tape Dental Injury: Teeth and Oropharynx as per pre-operative assessment

## 2023-11-20 NOTE — Anesthesia Preprocedure Evaluation (Addendum)
 Anesthesia Evaluation  Patient identified by MRN, date of birth, ID band Patient awake    Reviewed: Allergy & Precautions, NPO status , Patient's Chart, lab work & pertinent test results, reviewed documented beta blocker date and time   History of Anesthesia Complications Negative for: history of anesthetic complications  Airway Mallampati: III  TM Distance: >3 FB Neck ROM: Full    Dental  (+) Dental Advisory Given, Teeth Intact   Pulmonary neg pulmonary ROS   Pulmonary exam normal        Cardiovascular hypertension, Pt. on medications and Pt. on home beta blockers Normal cardiovascular exam   '25 TEE - EF 55 to 60%. There is mild concentric left ventricular hypertrophy. Left atrial size was mildly dilated. Right atrial size was mildly dilated. Trivial mitral valve regurgitation. There is mild dilatation of the ascending aorta, measuring 40 mm.     Neuro/Psych  Tinnitus  CVA, No Residual Symptoms  negative psych ROS   GI/Hepatic negative GI ROS, Neg liver ROS,,,  Endo/Other   Obesity   Renal/GU Renal InsufficiencyRenal disease     Musculoskeletal  (+) Arthritis , Osteoarthritis,    Abdominal   Peds  Hematology  On eliquis     Anesthesia Other Findings   Reproductive/Obstetrics                              Anesthesia Physical Anesthesia Plan  ASA: 3  Anesthesia Plan: General   Post-op Pain Management: Tylenol  PO (pre-op)* and Minimal or no pain anticipated   Induction: Intravenous  PONV Risk Score and Plan: 2 and Treatment may vary due to age or medical condition, Ondansetron  and Dexamethasone   Airway Management Planned: Oral ETT  Additional Equipment: None  Intra-op Plan:   Post-operative Plan: Extubation in OR  Informed Consent: I have reviewed the patients History and Physical, chart, labs and discussed the procedure including the risks, benefits and alternatives for  the proposed anesthesia with the patient or authorized representative who has indicated his/her understanding and acceptance.     Dental advisory given  Plan Discussed with: CRNA and Anesthesiologist  Anesthesia Plan Comments:          Anesthesia Quick Evaluation

## 2023-11-20 NOTE — Transfer of Care (Signed)
 Immediate Anesthesia Transfer of Care Note  Patient: Steve Murphy  Procedure(s) Performed: ATRIAL FIBRILLATION ABLATION  Patient Location: Cath Lab  Anesthesia Type:General  Level of Consciousness: awake, alert , oriented, and patient cooperative  Airway & Oxygen Therapy: Patient Spontanous Breathing and Patient connected to nasal cannula oxygen  Post-op Assessment: Report given to RN and Post -op Vital signs reviewed and stable  Post vital signs: Reviewed and stable  Last Vitals:  Vitals Value Taken Time  BP    Temp    Pulse 52 11/20/23 13:37  Resp 18 11/20/23 13:37  SpO2 96 % 11/20/23 13:37  Vitals shown include unfiled device data.  Last Pain:  Vitals:   11/20/23 1018  TempSrc:   PainSc: 0-No pain         Complications: There were no known notable events for this encounter.

## 2023-11-20 NOTE — Discharge Instructions (Signed)

## 2023-11-21 ENCOUNTER — Telehealth (HOSPITAL_COMMUNITY): Payer: Self-pay

## 2023-11-21 NOTE — Telephone Encounter (Signed)
 Spoke with patient to complete post procedure follow up call.  Patient reports no complications with groin sites.   Instructions reviewed with patient:  Remove large bandage at puncture site after 24 hours. It is normal to have bruising, tenderness, mild swelling, and a pea or marble sized lump/knot at the groin site which can take up to three months to resolve.  Get help right away if you notice sudden swelling at the puncture site.  Check your puncture site every day for signs of infection: fever, redness, swelling, pus drainage, warmth, foul odor or excessive pain. If this occurs, please call [phone], to speak with the RN Navigator. Get help right away if your puncture site is bleeding and the bleeding does not stop after applying firm pressure to the area.  You may continue to have skipped beats/ atrial fibrillation during the first several months after your procedure.  It is very important not to miss any doses of your blood thinner Eliquis .    You will follow up with the Afib clinic 4 weeks after your procedure and follow up with the Afib clinic 3 months after your procedure.  Activity restrictions reviewed.  Patient verbalized understanding to all instructions provided.

## 2023-11-21 NOTE — Anesthesia Postprocedure Evaluation (Signed)
 Anesthesia Post Note  Patient: Steve Murphy  Procedure(s) Performed: ATRIAL FIBRILLATION ABLATION     Patient location during evaluation: PACU Anesthesia Type: General Level of consciousness: sedated and patient cooperative Pain management: pain level controlled Vital Signs Assessment: post-procedure vital signs reviewed and stable Respiratory status: spontaneous breathing Cardiovascular status: stable Anesthetic complications: no   There were no known notable events for this encounter.  Last Vitals:  Vitals:   11/20/23 1600 11/20/23 1716  BP: 116/68 134/75  Pulse: (!) 53 (!) 58  Resp: 13 16  Temp:    SpO2: 94% 95%    Last Pain:  Vitals:   11/20/23 1415  TempSrc:   PainSc: 0-No pain                 Norleen Pope

## 2023-11-24 LAB — POCT ACTIVATED CLOTTING TIME
Activated Clotting Time: 273 s
Activated Clotting Time: 302 s

## 2023-12-01 ENCOUNTER — Telehealth: Payer: Self-pay | Admitting: Cardiology

## 2023-12-01 LAB — LAB REPORT - SCANNED
A1c: 4.9
EGFR: 46

## 2023-12-01 NOTE — Telephone Encounter (Signed)
 Wife Magalene) stated patient recently had an ablation and has redness at groin site and bruising/bleeding down leg to knee.  Wife wants call back directly to patient at [phone] or can call her if no response from patient.

## 2023-12-01 NOTE — Telephone Encounter (Signed)
 Patient had ablation on 11/20/23. Since then, patient has noted bleeding at groin site, appears red at the site and extends down to knee as bruising that has progressively worsen since procedure.  Patient reports redness and some tenderness at groin site. Patient also has developed swelling in legs and feet.   Advised patient to go to ED for evaluation. Patient states he will speak with his PCP first.  Will forward to Dr. Inocencio to review.

## 2023-12-01 NOTE — Telephone Encounter (Signed)
 Followed up with patient who reports that he did go see PCP today who evaluated area and told him that thus far all is normal, no concerns at this time. Aware I will update Dr. Inocencio. Pt will call back if need to re-address

## 2023-12-17 ENCOUNTER — Other Ambulatory Visit (HOSPITAL_COMMUNITY): Payer: Self-pay

## 2023-12-18 ENCOUNTER — Ambulatory Visit (HOSPITAL_COMMUNITY)
Admission: RE | Admit: 2023-12-18 | Discharge: 2023-12-18 | Disposition: A | Source: Ambulatory Visit | Attending: Physician Assistant | Admitting: Physician Assistant

## 2023-12-18 VITALS — BP 182/84 | HR 50 | Ht 72.0 in | Wt 228.6 lb

## 2023-12-18 DIAGNOSIS — I4819 Other persistent atrial fibrillation: Secondary | ICD-10-CM | POA: Diagnosis not present

## 2023-12-18 DIAGNOSIS — I4891 Unspecified atrial fibrillation: Secondary | ICD-10-CM | POA: Insufficient documentation

## 2023-12-18 DIAGNOSIS — Z5181 Encounter for therapeutic drug level monitoring: Secondary | ICD-10-CM | POA: Diagnosis not present

## 2023-12-18 DIAGNOSIS — D6869 Other thrombophilia: Secondary | ICD-10-CM | POA: Insufficient documentation

## 2023-12-18 DIAGNOSIS — I10 Essential (primary) hypertension: Secondary | ICD-10-CM | POA: Insufficient documentation

## 2023-12-18 DIAGNOSIS — Z79899 Other long term (current) drug therapy: Secondary | ICD-10-CM | POA: Diagnosis present

## 2023-12-18 NOTE — Progress Notes (Signed)
 Primary Care Physician: Barbra Odor, NP Primary Cardiologist: Gordy Bergamo, MD Electrophysiologist: Will Gladis Norton, MD  Referring Physician: Dr Norton Carlin Steve Murphy is a 73 y.o. male with a history of CVA, HTN, OSA, atrial fibrillation who presents for follow up in the [facility]. Atrial fibrillation was diagnosed 11/06/2022. He had cardioversion 02/27/2023. Echo showed a normal ejection fraction. He presented to cardiology clinic 06/27/2023 with fatigue and weakness. He was found to be in atrial fibrillation and is post cardioversion 07/21/2023 which was unsuccessful. He was transitioned to amiodarone  as a bridge to ablation and had another DCCV on 09/09/23. He is s/p afib ablation on 11/20/23. Patient is on Eliquis  for stroke prevention.    Patient presents today for follow up for atrial fibrillation. He remains in SR today. He denies chest pain or groin issues. He continues to have fatigue with exertion but admits this has been chronic for years. No bleeding issues on anticoagulation.   Today, he denies symptoms of palpitations, chest pain, shortness of breath, orthopnea, PND, lower extremity edema, dizziness, presyncope, syncope, bleeding, or neurologic sequela. The patient is tolerating medications without difficulties and is otherwise without complaint today.    Atrial Fibrillation Risk Factors:  he does have symptoms or diagnosis of sleep apnea. he does not have a history of rheumatic fever.   Atrial Fibrillation Management history:  Previous antiarrhythmic drugs: flecainide , amiodarone   Previous cardioversions: 02/27/23, 06/27/23, 09/09/23 Previous ablations: 11/20/23 Anticoagulation history: Eliquis   ROS- All systems are reviewed and negative except as per the HPI above.  Past Medical History:  Diagnosis Date   Arthritis    L hip    Fracture    Hypertension    pt. runs a tad hypertensive, states that he turned down medicine ( RX ) from  PCP    Low testosterone level in male    PAC (premature atrial contraction)    Ringing in ears    Sexually transmitted disease     Current Outpatient Medications  Medication Sig Dispense Refill   acetaminophen  (TYLENOL ) 500 MG tablet Take 1,000 mg by mouth every 6 (six) hours as needed for moderate pain (pain score 4-6). (Patient taking differently: Take 1,000 mg by mouth as needed for moderate pain (pain score 4-6).)     amiodarone  (PACERONE ) 200 MG tablet Take 1 tablet (200 mg total) by mouth daily. 90 tablet 2   apixaban  (ELIQUIS ) 5 MG TABS tablet Take 1 tablet (5 mg total) by mouth 2 (two) times daily. 180 tablet 1   loratadine (CLARITIN) 10 MG tablet Take 10 mg by mouth daily.     losartan  (COZAAR ) 25 MG tablet Take 1 tablet (25 mg total) by mouth every evening. 90 tablet 3   metoprolol  tartrate (LOPRESSOR ) 25 MG tablet Take 1 tablet (25 mg total) by mouth 2 (two) times daily. (Patient taking differently: Taking 12.5mg  in the am and 25mg  in the evening) 180 tablet 3   tamsulosin  (FLOMAX ) 0.4 MG CAPS capsule Take 0.4 mg by mouth daily.     valACYclovir (VALTREX) 1000 MG tablet Take 500 mg by mouth daily.     No current facility-administered medications for this encounter.    Physical Exam: BP (!) 182/84   Pulse (!) 50   Ht 6' (1.829 m)   Wt 103.7 kg   BMI 31.00 kg/m   GEN: Well nourished, well developed in no acute distress CARDIAC: Regular rate and rhythm, no murmurs, rubs, gallops RESPIRATORY:  Clear to auscultation without rales, wheezing or rhonchi  ABDOMEN: Soft, non-tender, non-distended EXTREMITIES:  1+ bilateral lower extremity edema, No deformity   Wt Readings from Last 3 Encounters:  12/18/23 103.7 kg  11/20/23 102.1 kg  11/04/23 103.9 kg     EKG Interpretation Date/Time:  Thursday December 18 2023 13:22:43 EST Ventricular Rate:  50 PR Interval:  196 QRS Duration:  88 QT Interval:  474 QTC Calculation: 432 R Axis:   66  Text Interpretation: Sinus  bradycardia Otherwise normal ECG When compared with ECG of 20-Nov-2023 13:42, No significant change was found Confirmed by Kallin Henk ([id number]) on 12/18/2023 2:25:33 PM    Echo 10/31/22 demonstrated   1. Left ventricular ejection fraction, by estimation, is 50 to 55%. The  left ventricle has low normal function. The left ventricle has no regional  wall motion abnormalities. There is mild concentric left ventricular  hypertrophy. Left ventricular  diastolic parameters are indeterminate.   2. Right ventricular systolic function is normal. The right ventricular  size is normal.   3. Left atrial size was mildly dilated.   4. Right atrial size was mildly dilated.   5. The mitral valve is normal in structure. Trivial mitral valve  regurgitation. No evidence of mitral stenosis.   6. The aortic valve is tricuspid. There is mild calcification of the  aortic valve. Aortic valve regurgitation is not visualized. No aortic  stenosis is present.   7. Aortic dilatation noted. There is mild dilatation of the ascending  aorta, measuring 39 mm.   8. The patient was in atrial fibrillation.   9. The inferior vena cava is normal in size with greater than 50%  respiratory variability, suggesting right atrial pressure of 3 mmHg.    CHA2DS2-VASc Score = 4  The patient's score is based upon: CHF History: 0 HTN History: 1 Diabetes History: 0 Stroke History: 2 Vascular Disease History: 0 Age Score: 1 Gender Score: 0       ASSESSMENT AND PLAN: Persistent Atrial Fibrillation (ICD10:  I48.19) The patient's CHA2DS2-VASc score is 4, indicating a 4.8% annual risk of stroke.   S/p afib ablation 11/20/23 Patient appears to be maintaining SR. Offered to decrease amiodarone  or metoprolol  to see if this helps his fatigue. He would like to continue his present therapy for now. Continue amiodarone  200 mg daily Continue Lopressor  12.5 mg AM and 25 mg PM Continue Eliquis  5 mg BID  Secondary Hypercoagulable State  (ICD10:  D68.69) The patient is at significant risk for stroke/thromboembolism based upon his CHA2DS2-VASc Score of 4.  Continue Apixaban  (Eliquis ). No bleeding issues.   High Risk Medication Monitoring (ICD 10: [id number]) Patient requires ongoing monitoring for anti-arrhythmic medication which has the potential to cause life threatening arrhythmias. Intervals on ECG acceptable for amiodarone  monitoring.   OSA  Encouraged nightly CPAP  HTN Elevated today. He reports home readings are within the normal range. No changes today, continue to monitor.    Follow up in the AF clinic in 2 months.     [facility] [address] [place], [place] [address] [phone]

## 2024-01-13 ENCOUNTER — Other Ambulatory Visit (HOSPITAL_COMMUNITY): Payer: Self-pay

## 2024-02-23 ENCOUNTER — Ambulatory Visit (HOSPITAL_COMMUNITY): Admitting: Physician Assistant

## 2024-05-10 ENCOUNTER — Ambulatory Visit: Admitting: Cardiology
